# Patient Record
Sex: Male | Born: 1970 | Hispanic: Yes | Marital: Married | Smoking: Never smoker
Health system: Southern US, Community
[De-identification: ages and names within clinical notes are randomized; demographics above are authoritative.]

## PROBLEM LIST (undated history)

## (undated) DIAGNOSIS — Z789 Other specified health status: Secondary | ICD-10-CM

## (undated) DIAGNOSIS — F1090 Alcohol use, unspecified, uncomplicated: Secondary | ICD-10-CM

---

## 2020-10-12 ENCOUNTER — Emergency Department: Payer: Medicaid Other

## 2020-10-12 ENCOUNTER — Other Ambulatory Visit: Payer: Self-pay

## 2020-10-12 ENCOUNTER — Encounter: Payer: Self-pay | Admitting: Internal Medicine

## 2020-10-12 ENCOUNTER — Inpatient Hospital Stay
Admission: EM | Admit: 2020-10-12 | Discharge: 2020-10-16 | DRG: 282 | Disposition: A | Discharge status: Other Acute Inpatient Hospital | Payer: Medicaid Other | Source: Ambulatory Visit | Attending: Student | Admitting: Student

## 2020-10-12 DIAGNOSIS — Z8249 Family history of ischemic heart disease and other diseases of the circulatory system: Secondary | ICD-10-CM

## 2020-10-12 DIAGNOSIS — I25110 Atherosclerotic heart disease of native coronary artery with unstable angina pectoris: Secondary | ICD-10-CM | POA: Diagnosis present

## 2020-10-12 DIAGNOSIS — Z833 Family history of diabetes mellitus: Secondary | ICD-10-CM | POA: Diagnosis not present

## 2020-10-12 DIAGNOSIS — Z20822 Contact with and (suspected) exposure to covid-19: Secondary | ICD-10-CM | POA: Diagnosis present

## 2020-10-12 DIAGNOSIS — F17200 Nicotine dependence, unspecified, uncomplicated: Secondary | ICD-10-CM | POA: Diagnosis present

## 2020-10-12 DIAGNOSIS — I214 Non-ST elevation (NSTEMI) myocardial infarction: Principal | ICD-10-CM | POA: Diagnosis present

## 2020-10-12 DIAGNOSIS — Z789 Other specified health status: Secondary | ICD-10-CM | POA: Insufficient documentation

## 2020-10-12 DIAGNOSIS — I2584 Coronary atherosclerosis due to calcified coronary lesion: Secondary | ICD-10-CM | POA: Diagnosis present

## 2020-10-12 DIAGNOSIS — I10 Essential (primary) hypertension: Secondary | ICD-10-CM | POA: Diagnosis present

## 2020-10-12 DIAGNOSIS — E785 Hyperlipidemia, unspecified: Secondary | ICD-10-CM | POA: Diagnosis present

## 2020-10-12 DIAGNOSIS — I2541 Coronary artery aneurysm: Secondary | ICD-10-CM | POA: Diagnosis present

## 2020-10-12 DIAGNOSIS — F1090 Alcohol use, unspecified, uncomplicated: Secondary | ICD-10-CM | POA: Diagnosis present

## 2020-10-12 DIAGNOSIS — Z597 Insufficient social insurance and welfare support: Secondary | ICD-10-CM

## 2020-10-12 DIAGNOSIS — R079 Chest pain, unspecified: Secondary | ICD-10-CM

## 2020-10-12 HISTORY — DX: Other specified health status: Z78.9

## 2020-10-12 HISTORY — DX: Alcohol use, unspecified, uncomplicated: F10.90

## 2020-10-12 LAB — URINE DRUG SCREEN, QUALITATIVE (ARMC ONLY)
Amphetamines, Ur Screen: NOT DETECTED
Barbiturates, Ur Screen: NOT DETECTED
Benzodiazepine, Ur Scrn: NOT DETECTED
Cannabinoid 50 Ng, Ur ~~LOC~~: NOT DETECTED
Cocaine Metabolite,Ur ~~LOC~~: NOT DETECTED
MDMA (Ecstasy)Ur Screen: NOT DETECTED
Methadone Scn, Ur: NOT DETECTED
Opiate, Ur Screen: NOT DETECTED
Phencyclidine (PCP) Ur S: NOT DETECTED
Tricyclic, Ur Screen: NOT DETECTED

## 2020-10-12 LAB — CBC
HCT: 45.5 % (ref 39.0–52.0)
Hemoglobin: 16.1 g/dL (ref 13.0–17.0)
MCH: 34.5 pg — ABNORMAL HIGH (ref 26.0–34.0)
MCHC: 35.4 g/dL (ref 30.0–36.0)
MCV: 97.4 fL (ref 80.0–100.0)
Platelets: 144 10*3/uL — ABNORMAL LOW (ref 150–400)
RBC: 4.67 MIL/uL (ref 4.22–5.81)
RDW: 12.6 % (ref 11.5–15.5)
WBC: 7.9 10*3/uL (ref 4.0–10.5)
nRBC: 0 % (ref 0.0–0.2)

## 2020-10-12 LAB — BASIC METABOLIC PANEL
Anion gap: 9 (ref 5–15)
BUN: 11 mg/dL (ref 6–20)
CO2: 26 mmol/L (ref 22–32)
Calcium: 9.6 mg/dL (ref 8.9–10.3)
Chloride: 100 mmol/L (ref 98–111)
Creatinine, Ser: 0.7 mg/dL (ref 0.61–1.24)
GFR, Estimated: >60 mL/min (ref >60)
Glucose, Bld: 86 mg/dL (ref 70–99)
Potassium: 3.6 mmol/L (ref 3.5–5.1)
Sodium: 135 mmol/L (ref 135–145)

## 2020-10-12 LAB — TROPONIN I (HIGH SENSITIVITY)
Troponin I (High Sensitivity): 143 ng/L (ref <18)
Troponin I (High Sensitivity): 164 ng/L (ref <18)

## 2020-10-12 LAB — PROTIME-INR
INR: 1 (ref 0.8–1.2)
Prothrombin Time: 13.5 seconds (ref 11.4–15.2)

## 2020-10-12 LAB — RESP PANEL BY RT-PCR (FLU A&B, COVID) ARPGX2
Influenza A by PCR: NEGATIVE
Influenza B by PCR: NEGATIVE
SARS Coronavirus 2 by RT PCR: NEGATIVE

## 2020-10-12 LAB — HEPARIN LEVEL (UNFRACTIONATED): Heparin Unfractionated: 0.18 IU/mL — ABNORMAL LOW (ref 0.30–0.70)

## 2020-10-12 LAB — APTT: aPTT: 30 seconds (ref 24–36)

## 2020-10-12 MED ORDER — LORAZEPAM 2 MG/ML IJ SOLN: 0.0000 mg | Freq: Two times a day (BID) | INTRAMUSCULAR | Status: DC

## 2020-10-12 MED ORDER — ADULT MULTIVITAMIN W/MINERALS CH
1.0000 | ORAL_TABLET | Freq: Every day | ORAL | Status: DC
  Administered 2020-10-12 – 2020-10-16 (×4): 1 via ORAL
  Filled 2020-10-12 (×5): qty 1

## 2020-10-12 MED ORDER — ATORVASTATIN CALCIUM 20 MG PO TABS
40.0000 mg | ORAL_TABLET | Freq: Every day | ORAL | Status: DC
  Administered 2020-10-12 – 2020-10-13 (×2): 40 mg via ORAL
  Filled 2020-10-12 (×2): qty 2

## 2020-10-12 MED ORDER — THIAMINE HCL 100 MG/ML IJ SOLN
100.0000 mg | Freq: Every day | INTRAMUSCULAR | Status: DC
  Filled 2020-10-12 (×3): qty 2

## 2020-10-12 MED ORDER — FOLIC ACID 1 MG PO TABS
1.0000 mg | ORAL_TABLET | Freq: Every day | ORAL | Status: DC
  Administered 2020-10-12 – 2020-10-16 (×4): 1 mg via ORAL
  Filled 2020-10-12 (×5): qty 1

## 2020-10-12 MED ORDER — ASPIRIN 81 MG PO CHEW
81.0000 mg | CHEWABLE_TABLET | Freq: Every day | ORAL | Status: DC
  Administered 2020-10-13 – 2020-10-16 (×3): 81 mg via ORAL
  Filled 2020-10-12 (×4): qty 1

## 2020-10-12 MED ORDER — THIAMINE HCL 100 MG PO TABS
100.0000 mg | ORAL_TABLET | Freq: Every day | ORAL | Status: DC
  Administered 2020-10-12 – 2020-10-16 (×3): 100 mg via ORAL
  Filled 2020-10-12 (×4): qty 1

## 2020-10-12 MED ORDER — ONDANSETRON HCL 4 MG/2ML IJ SOLN: 4.0000 mg | Freq: Three times a day (TID) | INTRAMUSCULAR | Status: DC | PRN

## 2020-10-12 MED ORDER — HEPARIN BOLUS VIA INFUSION
4000.0000 [IU] | Freq: Once | INTRAVENOUS | Status: AC
  Administered 2020-10-12: 4000 [IU] via INTRAVENOUS
  Filled 2020-10-12: qty 4000

## 2020-10-12 MED ORDER — ACETAMINOPHEN 325 MG PO TABS: 650.0000 mg | ORAL_TABLET | Freq: Four times a day (QID) | ORAL | Status: DC | PRN

## 2020-10-12 MED ORDER — HYDRALAZINE HCL 20 MG/ML IJ SOLN: 5.0000 mg | INTRAMUSCULAR | Status: DC | PRN

## 2020-10-12 MED ORDER — NITROGLYCERIN 0.4 MG SL SUBL
0.4000 mg | SUBLINGUAL_TABLET | SUBLINGUAL | Status: DC | PRN
Start: 2020-10-12 — End: 2020-10-17

## 2020-10-12 MED ORDER — SODIUM CHLORIDE 0.9 % IV SOLN: INTRAVENOUS | Status: DC

## 2020-10-12 MED ORDER — LORAZEPAM 2 MG/ML IJ SOLN: 0.0000 mg | Freq: Four times a day (QID) | INTRAMUSCULAR | Status: DC

## 2020-10-12 MED ORDER — MORPHINE SULFATE (PF) 2 MG/ML IV SOLN: 2.0000 mg | INTRAVENOUS | Status: DC | PRN

## 2020-10-12 MED ORDER — LORAZEPAM 1 MG PO TABS: 1.0000 mg | ORAL_TABLET | ORAL | Status: DC | PRN

## 2020-10-12 MED ORDER — ASPIRIN 81 MG PO CHEW
324.0000 mg | CHEWABLE_TABLET | Freq: Once | ORAL | Status: AC
  Administered 2020-10-12: 324 mg via ORAL
  Filled 2020-10-12: qty 4

## 2020-10-12 MED ORDER — HEPARIN (PORCINE) 25000 UT/250ML-% IV SOLN
1100.0000 [IU]/h | INTRAVENOUS | Status: DC
  Administered 2020-10-12: 850 [IU]/h via INTRAVENOUS
  Administered 2020-10-13: 1100 [IU]/h via INTRAVENOUS
  Filled 2020-10-12 (×2): qty 250

## 2020-10-12 MED ORDER — SODIUM CHLORIDE 0.9% FLUSH
3.0000 mL | Freq: Two times a day (BID) | INTRAVENOUS | Status: DC
  Administered 2020-10-12 – 2020-10-16 (×5): 3 mL via INTRAVENOUS

## 2020-10-12 MED ORDER — LORAZEPAM 2 MG/ML IJ SOLN: 1.0000 mg | INTRAMUSCULAR | Status: DC | PRN

## 2020-10-12 NOTE — Consult Note (Signed)
[facility] Cardiology Consultation Note  Patient ID: Nathan Nielsen, MRN: [id number], DOB/AGE: [id number] y.o. Admit date: 10/12/2020   Date of Consult: 10/12/2020 Primary Physician: Pcp, No Primary Cardiologist: None  Chief Complaint:  Chief Complaint  Patient presents with  . Chest Pain   Reason for Consult:  Chest pain with elevated troponin  HPI: 50 y.o. male with no significant past medical history who presents to the ED with complaints of intermittent chest pain radiating to the neck and down both of his arms that is associated with exercise and relieved by rest.  Patient has had chest pain for approximately 1 week typically located in the substernal area described as a pressure which radiates to the neck and down both arms.  Patient also endorses worsening chest pain and shortness of breath with exercise that is relieved by rest.  Patient has no history of cardiovascular disease and denies a history of hypertension, hyperlipidemia, type 2 diabetes.  Patient does have a significant family history of cardiovascular disease.  Patient was found to have an elevated troponin of 143 in the ED with negative chest x-ray.  Past Medical History:  Diagnosis Date  . Alcohol use    4-5 times per week, 4-5 beers each time      Surgical History: History reviewed. No pertinent surgical history.   Home Meds: Prior to Admission medications   Medication Sig Start Date End Date Taking? Authorizing Provider  acetaminophen (TYLENOL) 325 MG tablet Take 650 mg by mouth every 6 (six) hours as needed for mild pain.   Yes [provider]    Inpatient Medications:  . [START ON 10/13/2020] aspirin  81 mg Oral Daily  . atorvastatin  40 mg Oral Daily  . folic acid  1 mg Oral Daily  . LORazepam  0-4 mg Intravenous Q6H   Followed by  . [START ON 10/14/2020] LORazepam  0-4 mg Intravenous Q12H  . multivitamin with minerals  1 tablet Oral Daily  . thiamine  100 mg Oral Daily   Or  .  thiamine  100 mg Intravenous Daily   . heparin 850 Units/hr (10/12/20 1552)    Allergies: No Known Allergies  Social History   Socioeconomic History  . Marital status: Married    Spouse name: Not on file  . Number of children: Not on file  . Years of education: Not on file  . Highest education level: Not on file  Occupational History  . Not on file  Tobacco Use  . Smoking status: Never  . Smokeless tobacco: Never  Substance and Sexual Activity  . Alcohol use: Yes  . Drug use: Never  . Sexual activity: Not on file  Other Topics Concern  . Not on file  Social History Narrative  . Not on file   Social Determinants of Health   Financial Resource Strain: Not on file  Food Insecurity: Not on file  Transportation Needs: Not on file  Physical Activity: Not on file  Stress: Not on file  Social Connections: Not on file  Intimate Partner Violence: Not on file     Family History  Problem Relation Age of Onset  . Diabetes Mellitus II Mother   . Hypertension Mother   . Hypertension Brother      Review of Systems Positive for fatigue, chest pain, shortness of breath Negative for: General:  chills, fever, night sweats or weight changes.  Cardiovascular: PND orthopnea syncope dizziness  Dermatological skin lesions rashes Respiratory: Cough congestion Urologic:  Frequent urination urination at night and hematuria Abdominal: negative for nausea, vomiting, diarrhea, bright red blood per rectum, melena, or hematemesis Neurologic: negative for visual changes, and/or hearing changes  All other systems reviewed and are otherwise negative except as noted above.  Labs: No results for input(s): CKTOTAL, CKMB, TROPONINI in the last 72 hours. Lab Results  Component Value Date   WBC 7.9 10/12/2020   HGB 16.1 10/12/2020   HCT 45.5 10/12/2020   MCV 97.4 10/12/2020   PLT 144 (L) 10/12/2020    Recent Labs  Lab 10/12/20 1205  NA 135  K 3.6  CL 100  CO2 26  BUN 11  CREATININE  0.70  CALCIUM 9.6  GLUCOSE 86   No results found for: CHOL, HDL, LDLCALC, TRIG No results found for: DDIMER  Radiology/Studies:  DG Chest 2 View  Result Date: 10/12/2020 CLINICAL DATA:  Chest pain EXAM: CHEST - 2 VIEW COMPARISON:  None. FINDINGS: The heart size and mediastinal contours are within normal limits. Both lungs are clear. The visualized skeletal structures are unremarkable. IMPRESSION: No active cardiopulmonary disease. Electronically Signed   By: Wiliam Ke M.D.   On: 10/12/2020 12:46    EKG: Normal sinus rhythm  Weights: Filed Weights   10/12/20 1201  Weight: 81.6 kg     Physical Exam: Blood pressure (!) 144/78, pulse 71, temperature 98.1 F (36.7 C), temperature source Oral, resp. rate 18, height 5\' 3"  (1.6 m), weight 81.6 kg, SpO2 99 %. Body mass index is 31.89 kg/m. General: Well developed, well nourished, in no acute distress. Head eyes ears nose throat: Normocephalic, atraumatic, sclera non-icteric, no xanthomas, nares are without discharge. No apparent thyromegaly and/or mass  Lungs: Normal respiratory effort.  no wheezes, no rales, no rhonchi.  Heart: RRR with normal S1 S2. no murmur gallop, no rub, PMI is normal size and placement, carotid upstroke normal without bruit, jugular venous pressure is normal Abdomen: Soft, non-tender, non-distended with normoactive bowel sounds. No hepatomegaly. No rebound/guarding. No obvious abdominal masses. Abdominal aorta is normal size without bruit Extremities: No edema. no cyanosis, no clubbing, no ulcers  Peripheral : 2+ bilateral upper extremity pulses, 2+ bilateral femoral pulses, 2+ bilateral dorsal pedal pulse Neuro: Alert and oriented. No facial asymmetry. No focal deficit. Moves all extremities spontaneously. Musculoskeletal: Normal muscle tone without kyphosis Psych:  Responds to questions appropriately with a normal affect.    Assessment: 50 yo male with no significant past medical history presenting to the  ED with a 1 week history of chest pain and shortness of breath exacerbated by exercise and relieved by rest with radiation to the neck and arms, with mildly elevated troponin concerning for unstable angina  Plan: -Continue IV heparin, lipitor, aspirin -Continue to trend troponin -PRN nitroglycerin and morphine for chest pain management -Schedule cardiac catheterization for evaluation of coronary artery disease and unstable angina  Signed, [address] [facility] Cardiology 10/12/2020, 5:14 PM

## 2020-10-12 NOTE — ED Triage Notes (Signed)
Pt here with CP that started a week ago. Pt was sent here from his primary's office. Pt states pain is generalized and radiates to his right arm. Pt states that pain is intermittent and only occurs when he is doing activity. Pt also radiates to his neck. Pt in NAD in triage.

## 2020-10-12 NOTE — H&P (Signed)
History and Physical    Nathan Nielsen HQI:[id number] DOB: [birth year] DOA: 10/12/2020  Referring MD/NP/PA:   PCP: Pcp, No   Patient coming from:  The patient is coming from home.  At baseline, pt is independent for most of ADL.        Chief Complaint: chest pain   HPI: Nathan Nielsen is a 50 y.o. male with medical history significant of alcohol use, who presents with chest pain.  Patient has chest pain for almost a week, which is located in substernal area, intermittent, mild to moderate, pressure-like, exertional, radiating to the jaw, right arm and neck.  Patient also has exertional shortness of breath.  No cough, fever or chills.  Denies nausea, vomiting, diarrhea or abdominal pain.  No symptoms of UTI.  No recent head injury or rectal bleeding or dark stool. Patient states that she drinks beer 4-5 times per week and 4-5 beers each time.  ED Course: pt was found to have troponin level 143, pending COVID-19 PCR, electrolytes renal function okay, temperature normal, blood pressure 144/78, heart rate 71, RR 18, oxygen saturation 99% on room air.  Chest x-ray negative.  Patient is admitted to progressive bed as inpatient.  Dr. Gwen Pounds of cardiology is consulted  Review of Systems:   General: no fevers, chills, no body weight gain, has fatigue HEENT: no blurry vision, hearing changes or sore throat Respiratory: has dyspnea, no coughing, wheezing CV: has chest pain, no palpitations GI: no nausea, vomiting, abdominal pain, diarrhea, constipation GU: no dysuria, burning on urination, increased urinary frequency, hematuria  Ext: no leg edema Neuro: no unilateral weakness, numbness, or tingling, no vision change or hearing loss Skin: no rash, no skin tear. MSK: No muscle spasm, no deformity, no limitation of range of movement in spin Heme: No easy bruising.  Travel history: No recent long distant travel.  Allergy: No Known Allergies  Past Medical History:  Diagnosis Date    Alcohol use    4-5 times per week, 4-5 beers each time    History reviewed. No pertinent surgical history.  Social History:  reports that he has never smoked. He has never used smokeless tobacco. He reports current alcohol use. He reports that he does not use drugs.  Family History:  Family History  Problem Relation Age of Onset   Diabetes Mellitus II Mother    Hypertension Mother    Hypertension Brother      Prior to Admission medications   Not on File    Physical Exam: Vitals:   10/12/20 1201  BP: (!) 144/78  Pulse: 71  Resp: 18  Temp: 98.1 F (36.7 C)  TempSrc: Oral  SpO2: 99%  Weight: 81.6 kg  Height: 5\' 3"  (1.6 m)   General: Not in acute distress HEENT:       Eyes: PERRL, EOMI, no scleral icterus.       ENT: No discharge from the ears and nose, no pharynx injection, no tonsillar enlargement.        Neck: No JVD, no bruit, no mass felt. Heme: No neck lymph node enlargement. Cardiac: S1/S2, RRR, No murmurs, No gallops or rubs. Respiratory: No rales, wheezing, rhonchi or rubs. GI: Soft, nondistended, nontender, no rebound pain, no organomegaly, BS present. GU: No hematuria Ext: No pitting leg edema bilaterally. 1+DP/PT pulse bilaterally. Musculoskeletal: No joint deformities, No joint redness or warmth, no limitation of ROM in spin. Skin: No rashes.  Neuro: Alert, oriented X3, cranial nerves II-XII grossly intact, moves all  extremities normally. Psych: Patient is not psychotic, no suicidal or hemocidal ideation.  Labs on Admission: I have personally reviewed following labs and imaging studies  CBC: Recent Labs  Lab 10/12/20 1205  WBC 7.9  HGB 16.1  HCT 45.5  MCV 97.4  PLT 144*   Basic Metabolic Panel: Recent Labs  Lab 10/12/20 1205  NA 135  K 3.6  CL 100  CO2 26  GLUCOSE 86  BUN 11  CREATININE 0.70  CALCIUM 9.6   GFR: Estimated Creatinine Clearance: 106.7 mL/min (by C-G formula based on SCr of 0.7 mg/dL). Liver Function Tests: No results  for input(s): AST, ALT, ALKPHOS, BILITOT, PROT, ALBUMIN in the last 168 hours. No results for input(s): LIPASE, AMYLASE in the last 168 hours. No results for input(s): AMMONIA in the last 168 hours. Coagulation Profile: No results for input(s): INR, PROTIME in the last 168 hours. Cardiac Enzymes: No results for input(s): CKTOTAL, CKMB, CKMBINDEX, TROPONINI in the last 168 hours. BNP (last 3 results) No results for input(s): PROBNP in the last 8760 hours. HbA1C: No results for input(s): HGBA1C in the last 72 hours. CBG: No results for input(s): GLUCAP in the last 168 hours. Lipid Profile: No results for input(s): CHOL, HDL, LDLCALC, TRIG, CHOLHDL, LDLDIRECT in the last 72 hours. Thyroid Function Tests: No results for input(s): TSH, T4TOTAL, FREET4, T3FREE, THYROIDAB in the last 72 hours. Anemia Panel: No results for input(s): VITAMINB12, FOLATE, FERRITIN, TIBC, IRON, RETICCTPCT in the last 72 hours. Urine analysis: No results found for: COLORURINE, APPEARANCEUR, LABSPEC, PHURINE, GLUCOSEU, HGBUR, BILIRUBINUR, KETONESUR, PROTEINUR, UROBILINOGEN, NITRITE, LEUKOCYTESUR Sepsis Labs: @LABRCNTIP (procalcitonin:4,lacticidven:4) )No results found for this or any previous visit (from the past 240 hour(s)).   Radiological Exams on Admission: DG Chest 2 View  Result Date: 10/12/2020 CLINICAL DATA:  Chest pain EXAM: CHEST - 2 VIEW COMPARISON:  None. FINDINGS: The heart size and mediastinal contours are within normal limits. Both lungs are clear. The visualized skeletal structures are unremarkable. IMPRESSION: No active cardiopulmonary disease. Electronically Signed   By: 12/12/2020 M.D.   On: 10/12/2020 12:46     EKG: I have personally reviewed.  Sinus rhythm, T wave flattening  Assessment/Plan Principal Problem:   NSTEMI (non-ST elevated myocardial infarction) [facility]) Active Problems:   Alcohol use   NSTEMI (non-ST elevated myocardial infarction) [facility]): Troponin 143.  Symptoms are  typical for non-STEMI.  Dr. [facility] of cardiology is consulted.  -Admit to progressive unit as inpatient -IV heparin - Trend Trop - Repeat EKG in the am  - prn Nitroglycerin, Morphine - start aspirin, lipitor  - Risk factor stratification: will check FLP and A1C   Alcohol use: -Did counseling about the importance of quitting drinking -CIWA protocol     DVT ppx: on IV Heparin     Code Status: Full code Family Communication:     Yes, patient's  wife at bed side Disposition Plan:  Anticipate discharge back to previous environment Consults called: Dr. Gwen Pounds of cardiology Admission status and  Level of care: Progressive Cardiac:     as inpt        Status is: Inpatient  Remains inpatient appropriate because:Inpatient level of care appropriate due to severity of illness  Dispo: The patient is from: Home              Anticipated d/c is to: Home              Patient currently is not medically stable to d/c.   Difficult to place  patient No          Date of Service 10/12/2020    Lorretta Harp [facility]   If 7PM-7AM, please contact night-coverage [email or web address] 10/12/2020, 1:57 PM

## 2020-10-12 NOTE — ED Provider Notes (Signed)
[facility] Emergency Department Provider Note    Event Date/Time   First MD Initiated Contact with Patient 10/12/20 1300     (approximate)  I have reviewed the triage vital signs and the nursing notes.   HISTORY  Chief Complaint Chest Pain    HPI Donevin Jereme Loren is a 50 y.o. male presents the ER for several days of progressively worsening exertional chest pain and pressure radiating pain to his jaw associated with diaphoresis.  Is having some mild pressure and discomfort this morning and today.  No known cardiac history does have family history of cardiac disease.  No nausea or vomiting.  Does smoke occasionally.  Does not typically go to doctors does not know whether he has history of high blood pressure high cholesterol or diabetes.  No past medical history on file. No family history on file.  Patient Active Problem List   Diagnosis Date Noted   NSTEMI (non-ST elevated myocardial infarction) (HCC) 10/12/2020       Prior to Admission medications   Not on File    Allergies Patient has no known allergies.    Social History    Review of Systems Patient denies headaches, rhinorrhea, blurry vision, numbness, shortness of breath, chest pain, edema, cough, abdominal pain, nausea, vomiting, diarrhea, dysuria, fevers, rashes or hallucinations unless otherwise stated above in HPI. ____________________________________________   PHYSICAL EXAM:  VITAL SIGNS: Vitals:   10/12/20 1201  BP: (!) 144/78  Pulse: 71  Resp: 18  Temp: 98.1 F (36.7 C)  SpO2: 99%    Constitutional: Alert and oriented.  Eyes: Conjunctivae are normal.  Head: Atraumatic. Nose: No congestion/rhinnorhea. Mouth/Throat: Mucous membranes are moist.   Neck: No stridor. Painless ROM.  Cardiovascular: Normal rate, regular rhythm. Grossly normal heart sounds.  Good peripheral circulation. Respiratory: Normal respiratory effort.  No retractions. Lungs  CTAB. Gastrointestinal: Soft and nontender. No distention. No abdominal bruits. No CVA tenderness. Genitourinary:  Musculoskeletal: No lower extremity tenderness nor edema.  No joint effusions. Neurologic:  Normal speech and language. No gross focal neurologic deficits are appreciated. No facial droop Skin:  Skin is warm, dry and intact. No rash noted. Psychiatric: Mood and affect are normal. Speech and behavior are normal.  ____________________________________________   LABS (all labs ordered are listed, but only abnormal results are displayed)  Results for orders placed or performed during the hospital encounter of 10/12/20 (from the past 24 hour(s))  Basic metabolic panel     Status: None   Collection Time: 10/12/20 12:05 PM  Result Value Ref Range   Sodium 135 135 - 145 mmol/L   Potassium 3.6 3.5 - 5.1 mmol/L   Chloride 100 98 - 111 mmol/L   CO2 26 22 - 32 mmol/L   Glucose, Bld 86 70 - 99 mg/dL   BUN 11 6 - 20 mg/dL   Creatinine, Ser 5.17 0.61 - 1.24 mg/dL   Calcium 9.6 8.9 - 00.1 mg/dL   GFR, Estimated >74 >94 mL/min   Anion gap 9 5 - 15  CBC     Status: Abnormal   Collection Time: 10/12/20 12:05 PM  Result Value Ref Range   WBC 7.9 4.0 - 10.5 K/uL   RBC 4.67 4.22 - 5.81 MIL/uL   Hemoglobin 16.1 13.0 - 17.0 g/dL   HCT 49.6 75.9 - 16.3 %   MCV 97.4 80.0 - 100.0 fL   MCH 34.5 (H) 26.0 - 34.0 pg   MCHC 35.4 30.0 - 36.0 g/dL   RDW  12.6 11.5 - 15.5 %   Platelets 144 (L) 150 - 400 K/uL   nRBC 0.0 0.0 - 0.2 %  Troponin I (High Sensitivity)     Status: Abnormal   Collection Time: 10/12/20 12:05 PM  Result Value Ref Range   Troponin I (High Sensitivity) 143 (HH) <18 ng/L   ____________________________________________  EKG My review and personal interpretation at Time: 11:57   Indication: chest pain  Rate: 70  Rhythm: sinus Axis: normal Other: normal intervals, inferolateral st depression, no stemi ____________________________________________  RADIOLOGY  I personally  reviewed all radiographic images ordered to evaluate for the above acute complaints and reviewed radiology reports and findings.  These findings were personally discussed with the patient.  Please see medical record for radiology report.  ____________________________________________   PROCEDURES  Procedure(s) performed:  .Critical Care Performed by: Willy Eddy, MD Authorized by: Willy Eddy, MD   Critical care provider statement:    Critical care time (minutes):  15   Critical care time was exclusive of:  Separately billable procedures and treating other patients   Critical care was necessary to treat or prevent imminent or life-threatening deterioration of the following conditions:  Cardiac failure   Critical care was time spent personally by me on the following activities:  Development of treatment plan with patient or surrogate, discussions with consultants, evaluation of patient's response to treatment, examination of patient, obtaining history from patient or surrogate, ordering and performing treatments and interventions, ordering and review of laboratory studies, ordering and review of radiographic studies, pulse oximetry, re-evaluation of patient's condition and review of old charts    Critical Care performed: yes ____________________________________________   INITIAL IMPRESSION / ASSESSMENT AND PLAN / ED COURSE  Pertinent labs & imaging results that were available during my care of the patient were reviewed by me and considered in my medical decision making (see chart for details).   DDX: ACS, pericarditis, esophagitis, boerhaaves, pe, dissection, pna, bronchitis, costochondritis  Ozro Eydan Chianese is a 50 y.o. who presents to the ED with symptoms as described above.  Symptoms are concerning for unstable angina he still having some discomfort and pressure with findings concerning for NSTEMI as troponin is elevated to 140s does have some nonspecific ST T wave  changes with inferolateral ST depression.  No STEMI criteria.  Will give aspirin and heparinize.  I discussed with hospitalist for admission.     The patient was evaluated in Emergency Department today for the symptoms described in the history of present illness. He/she was evaluated in the context of the global COVID-19 pandemic, which necessitated consideration that the patient might be at risk for infection with the SARS-CoV-2 virus that causes COVID-19. Institutional protocols and algorithms that pertain to the evaluation of patients at risk for COVID-19 are in a state of rapid change based on information released by regulatory bodies including the CDC and federal and state organizations. These policies and algorithms were followed during the patient's care in the ED.  As part of my medical decision making, I reviewed the following data within the electronic MEDICAL RECORD NUMBER Nursing notes reviewed and incorporated, Labs reviewed, notes from prior ED visits and [place] Controlled Substance Database   ____________________________________________   FINAL CLINICAL IMPRESSION(S) / ED DIAGNOSES  Final diagnoses:  Chest pain, unspecified type      NEW MEDICATIONS STARTED DURING THIS VISIT:  New Prescriptions   No medications on file     Note:  This document was prepared using Dragon voice recognition software  and may include unintentional dictation errors.    Willy Eddy, MD 10/12/20 [phone]

## 2020-10-12 NOTE — ED Notes (Signed)
Blue top sent to lab at this time 

## 2020-10-12 NOTE — ED Notes (Signed)
Pt presents to the ED for CP that radiates up his neck with exertion for approx a week. Pt states the pain gets worse when he gets up to move or exert himself. Pt endorses mild SOB, states that he does get clammy and diaphoretic when his CP starts. Denies cough, fever, or NVD. Denies significant cardiac hx. Pt is A&Ox4 and NAD.

## 2020-10-12 NOTE — Consult Note (Signed)
ANTICOAGULATION CONSULT NOTE - Initial Consult  Pharmacy Consult for heparin  Indication: chest pain/ACS  No Known Allergies  Patient Measurements: Height: 5\' 3"  (160 cm) Weight: 81.6 kg (180 lb) IBW/kg (Calculated) : 56.9 Heparin Dosing Weight: 74.3kg  Vital Signs: Temp: 98.1 F (36.7 C) (10/10 1201) Temp Source: Oral (10/10 1201) BP: 144/78 (10/10 1201) Pulse Rate: 71 (10/10 1201)  Labs: Recent Labs    10/12/20 1205  HGB 16.1  HCT 45.5  PLT 144*  CREATININE 0.70  TROPONINIHS 143*    Estimated Creatinine Clearance: 106.7 mL/min (by C-G formula based on SCr of 0.7 mg/dL).   Medical History: No past medical history on file.   Assessment: 50yo M with no PMH on file was admitted to the hospital for chest pain. The pharmacy was consulted for heparin dosing.   Baseline PT/INR and aPTT ordered  PLT noted to be 144, otherwise CBC within normal limits   No PTA anticoagulants noted on chart review    Goal of Therapy:  Heparin level 0.3-0.7 units/ml Monitor platelets by anticoagulation protocol: Yes   Plan:  Give 4000 units bolus x 1 Start heparin infusion at 850 units/hr Check anti-Xa level in 6 hours and daily while on heparin Continue to monitor H&H and platelets  50yo, PharmD Pharmacy Resident  10/12/2020 1:22 PM

## 2020-10-13 ENCOUNTER — Encounter
Admission: EM | Disposition: A | Discharge status: Other Acute Inpatient Hospital | Payer: Self-pay | Source: Ambulatory Visit | Attending: Student

## 2020-10-13 ENCOUNTER — Other Ambulatory Visit: Payer: Self-pay

## 2020-10-13 HISTORY — PX: LEFT HEART CATH AND CORONARY ANGIOGRAPHY: CATH118249

## 2020-10-13 LAB — LIPID PANEL
Cholesterol: 286 mg/dL — ABNORMAL HIGH (ref 0–200)
HDL: 50 mg/dL (ref >40)
LDL Cholesterol: 209 mg/dL — ABNORMAL HIGH (ref 0–99)
Total CHOL/HDL Ratio: 5.7 RATIO
Triglycerides: 133 mg/dL (ref <150)
VLDL: 27 mg/dL (ref 0–40)

## 2020-10-13 LAB — CARDIAC CATHETERIZATION: Cath EF Quantitative: 65 %

## 2020-10-13 LAB — CBC
HCT: 44 % (ref 39.0–52.0)
Hemoglobin: 15.8 g/dL (ref 13.0–17.0)
MCH: 35 pg — ABNORMAL HIGH (ref 26.0–34.0)
MCHC: 35.9 g/dL (ref 30.0–36.0)
MCV: 97.3 fL (ref 80.0–100.0)
Platelets: 126 10*3/uL — ABNORMAL LOW (ref 150–400)
RBC: 4.52 MIL/uL (ref 4.22–5.81)
RDW: 12.8 % (ref 11.5–15.5)
WBC: 6 10*3/uL (ref 4.0–10.5)
nRBC: 0 % (ref 0.0–0.2)

## 2020-10-13 LAB — HEMOGLOBIN A1C
Hgb A1c MFr Bld: 6.1 % — ABNORMAL HIGH (ref 4.8–5.6)
Mean Plasma Glucose: 128 mg/dL

## 2020-10-13 LAB — HIV ANTIBODY (ROUTINE TESTING W REFLEX): HIV Screen 4th Generation wRfx: NONREACTIVE

## 2020-10-13 LAB — TROPONIN I (HIGH SENSITIVITY): Troponin I (High Sensitivity): 147 ng/L (ref <18)

## 2020-10-13 LAB — HEPARIN LEVEL (UNFRACTIONATED): Heparin Unfractionated: 0.56 IU/mL (ref 0.30–0.70)

## 2020-10-13 SURGERY — LEFT HEART CATH AND CORONARY ANGIOGRAPHY
Anesthesia: Moderate Sedation

## 2020-10-13 MED ORDER — HEPARIN SODIUM (PORCINE) 1000 UNIT/ML IJ SOLN
INTRAMUSCULAR | Status: DC | PRN
  Administered 2020-10-13: 4000 [IU] via INTRAVENOUS

## 2020-10-13 MED ORDER — SODIUM CHLORIDE 0.9% FLUSH: 3.0000 mL | INTRAVENOUS | Status: DC | PRN

## 2020-10-13 MED ORDER — SODIUM CHLORIDE 0.9 % WEIGHT BASED INFUSION
1.0000 mL/kg/h | INTRAVENOUS | Status: DC
  Administered 2020-10-13: 1 mL/kg/h via INTRAVENOUS

## 2020-10-13 MED ORDER — FENTANYL CITRATE (PF) 100 MCG/2ML IJ SOLN
INTRAMUSCULAR | Status: DC | PRN
  Administered 2020-10-13 (×2): 25 ug via INTRAVENOUS

## 2020-10-13 MED ORDER — HEPARIN (PORCINE) IN NACL 1000-0.9 UT/500ML-% IV SOLN
INTRAVENOUS | Status: AC
  Filled 2020-10-13: qty 1000

## 2020-10-13 MED ORDER — MIDAZOLAM HCL 2 MG/2ML IJ SOLN
INTRAMUSCULAR | Status: DC | PRN
  Administered 2020-10-13 (×2): 1 mg via INTRAVENOUS

## 2020-10-13 MED ORDER — VERAPAMIL HCL 2.5 MG/ML IV SOLN
INTRAVENOUS | Status: AC
  Filled 2020-10-13: qty 2

## 2020-10-13 MED ORDER — LIDOCAINE HCL (PF) 1 % IJ SOLN
INTRAMUSCULAR | Status: DC | PRN
  Administered 2020-10-13: 2 mL

## 2020-10-13 MED ORDER — HEPARIN (PORCINE) IN NACL 2000-0.9 UNIT/L-% IV SOLN
INTRAVENOUS | Status: DC | PRN
  Administered 2020-10-13: 1000 mL

## 2020-10-13 MED ORDER — FENTANYL CITRATE (PF) 100 MCG/2ML IJ SOLN
INTRAMUSCULAR | Status: AC
  Filled 2020-10-13: qty 2

## 2020-10-13 MED ORDER — ONDANSETRON HCL 4 MG/2ML IJ SOLN: 4.0000 mg | Freq: Four times a day (QID) | INTRAMUSCULAR | Status: DC | PRN

## 2020-10-13 MED ORDER — SODIUM CHLORIDE 0.9 % WEIGHT BASED INFUSION
3.0000 mL/kg/h | INTRAVENOUS | Status: DC
  Administered 2020-10-13: 3 mL/kg/h via INTRAVENOUS

## 2020-10-13 MED ORDER — VERAPAMIL HCL 2.5 MG/ML IV SOLN
INTRAVENOUS | Status: DC | PRN
  Administered 2020-10-13: 2.5 mg via INTRA_ARTERIAL

## 2020-10-13 MED ORDER — HEPARIN SODIUM (PORCINE) 1000 UNIT/ML IJ SOLN
INTRAMUSCULAR | Status: AC
  Filled 2020-10-13: qty 1

## 2020-10-13 MED ORDER — LABETALOL HCL 5 MG/ML IV SOLN: 10.0000 mg | INTRAVENOUS | Status: AC | PRN

## 2020-10-13 MED ORDER — ACETAMINOPHEN 325 MG PO TABS: 650.0000 mg | ORAL_TABLET | ORAL | Status: DC | PRN

## 2020-10-13 MED ORDER — IOHEXOL 350 MG/ML SOLN
INTRAVENOUS | Status: DC | PRN
  Administered 2020-10-13: 100 mL

## 2020-10-13 MED ORDER — ASPIRIN 81 MG PO CHEW: 81.0000 mg | CHEWABLE_TABLET | ORAL | Status: DC

## 2020-10-13 MED ORDER — HEPARIN BOLUS VIA INFUSION
2200.0000 [IU] | Freq: Once | INTRAVENOUS | Status: AC
  Administered 2020-10-13: 2200 [IU] via INTRAVENOUS
  Filled 2020-10-13: qty 2200

## 2020-10-13 MED ORDER — HEPARIN (PORCINE) 25000 UT/250ML-% IV SOLN
1000.0000 [IU]/h | INTRAVENOUS | Status: DC
  Administered 2020-10-14: 1250 [IU]/h via INTRAVENOUS
  Administered 2020-10-14: 1100 [IU]/h via INTRAVENOUS
  Administered 2020-10-15: 1250 [IU]/h via INTRAVENOUS
  Administered 2020-10-16: 1150 [IU]/h via INTRAVENOUS
  Filled 2020-10-13 (×4): qty 250

## 2020-10-13 MED ORDER — SODIUM CHLORIDE 0.9 % WEIGHT BASED INFUSION
1.0000 mL/kg/h | INTRAVENOUS | Status: AC
  Administered 2020-10-13: 1 mL/kg/h via INTRAVENOUS

## 2020-10-13 MED ORDER — HYDRALAZINE HCL 20 MG/ML IJ SOLN: 10.0000 mg | INTRAMUSCULAR | Status: AC | PRN

## 2020-10-13 MED ORDER — LIDOCAINE HCL 1 % IJ SOLN
INTRAMUSCULAR | Status: AC
  Filled 2020-10-13: qty 20

## 2020-10-13 MED ORDER — MIDAZOLAM HCL 2 MG/2ML IJ SOLN
INTRAMUSCULAR | Status: AC
  Filled 2020-10-13: qty 2

## 2020-10-13 MED ORDER — SODIUM CHLORIDE 0.9 % IV SOLN: 250.0000 mL | INTRAVENOUS | Status: DC | PRN

## 2020-10-13 SURGICAL SUPPLY — 13 items
CATH 5F 110X4 TIG (CATHETERS) ×2 IMPLANT
CATH INFINITI 5 FR JL3.5 (CATHETERS) ×2 IMPLANT
CATH INFINITI 5FR JK (CATHETERS) ×2 IMPLANT
CATH INFINITI JR4 5F (CATHETERS) ×2 IMPLANT
DEVICE RAD TR BAND REGULAR (VASCULAR PRODUCTS) ×2 IMPLANT
DRAPE BRACHIAL (DRAPES) ×2 IMPLANT
GLIDESHEATH SLEND SS 6F .021 (SHEATH) ×2 IMPLANT
GUIDEWIRE INQWIRE 1.5J.035X260 (WIRE) ×1 IMPLANT
INQWIRE 1.5J .035X260CM (WIRE) ×2
PACK CARDIAC CATH (CUSTOM PROCEDURE TRAY) ×2 IMPLANT
PROTECTION STATION PRESSURIZED (MISCELLANEOUS) ×2
SET ATX SIMPLICITY (MISCELLANEOUS) ×2 IMPLANT
STATION PROTECTION PRESSURIZED (MISCELLANEOUS) ×1 IMPLANT

## 2020-10-13 NOTE — Consult Note (Signed)
ANTICOAGULATION CONSULT NOTE  Pharmacy Consult for heparin  Indication: chest pain/ACS  No Known Allergies  Patient Measurements: Height: 5\' 3"  (160 cm) Weight: 81.6 kg (180 lb) IBW/kg (Calculated) : 56.9 Heparin Dosing Weight: 74.3kg  Vital Signs: Temp: 98.2 F (36.8 C) (10/11 1142) Temp Source: Oral (10/11 1142) BP: 135/67 (10/11 1700) Pulse Rate: 60 (10/11 1700)  Labs: Recent Labs    10/12/20 1205 10/12/20 1312 10/12/20 2208 10/13/20 0620  HGB 16.1  --   --  15.8  HCT 45.5  --   --  44.0  PLT 144*  --   --  126*  APTT  --  30  --   --   LABPROT  --  13.5  --   --   INR  --  1.0  --   --   HEPARINUNFRC  --   --  0.18* 0.56  CREATININE 0.70  --   --   --   TROPONINIHS 143*  --  164* 147*     Estimated Creatinine Clearance: 106.7 mL/min (by C-G formula based on SCr of 0.7 mg/dL).   Medical History: Past Medical History:  Diagnosis Date   Alcohol use    4-5 times per week, 4-5 beers each time     Assessment: 50yo M with no PMH on file was admitted to the hospital for chest pain. The pharmacy was consulted for heparin restart 8h after sheath removal post-cath..   Baseline labs PT: 13.5 INR: 1.0 aPTT: 30 PLT 144 Hgb 16.1  No PTA anticoagulants noted on chart review    Goal of Therapy:  Heparin level 0.3-0.7 units/ml Monitor platelets by anticoagulation protocol: Yes   Plan:  -Will restart heparin at 1100 units/hr 8h after sheath removal (10/12 0030) -Heparin level ordered 6h after heparin restart  -Daily CBC while on heparin ggt  04-25-1973, PharmD Pharmacy Resident  10/13/2020 5:15 PM

## 2020-10-13 NOTE — Progress Notes (Signed)
[facility] Cardiology [facility] Encounter Note  Patient: Nathan Nielsen / Admit Date: 10/12/2020 / Date of Encounter: 10/13/2020, 4:46 PM   Subjective: Patient done well overnight with no evidence of chest discomfort with appropriate [place] medical therapy for non-ST elevation myocardial infarction with peak troponin of 164.  Cardiac catheterization showing normal LV systolic function but significantly complicated two-vessel coronary artery disease with 90% proximal LAD with small aneurysmal segment and 90% mid right coronary artery stenosis.  Review of Systems: Positive for: Normal none Negative for: Vision change, hearing change, syncope, dizziness, nausea, vomiting,diarrhea, bloody stool, stomach pain, cough, congestion, diaphoresis, urinary frequency, urinary pain,skin lesions, skin rashes Others previously listed  Objective: Telemetry: Normal sinus rhythm Physical Exam: Blood pressure 124/85, pulse (!) 50, temperature 98.2 F (36.8 C), temperature source Oral, resp. rate 17, height 5\' 3"  (1.6 m), weight 81.6 kg, SpO2 96 %. Body mass index is 31.89 kg/m. General: Well developed, well nourished, in no acute distress. Head: Normocephalic, atraumatic, sclera non-icteric, no xanthomas, nares are without discharge. Neck: No apparent masses Lungs: Normal respirations with no wheezes, no rhonchi, no rales , no crackles   Heart: Regular rate and rhythm, normal S1 S2, no murmur, no rub, no gallop, PMI is normal size and placement, carotid upstroke normal without bruit, jugular venous pressure normal Abdomen: Soft, non-tender, non-distended with normoactive bowel sounds. No hepatosplenomegaly. Abdominal aorta is normal size without bruit Extremities: No edema, no clubbing, no cyanosis, no ulcers,  Peripheral: 2+ radial, 2+ femoral, 2+ dorsal pedal pulses Neuro: Alert and oriented. Moves all extremities spontaneously. Psych:  Responds to questions appropriately with a normal  affect.  No intake or output data in the 24 hours ending 10/13/20 1646  Inpatient Medications:  . [MAR Hold] aspirin  81 mg Oral Daily  . [START ON 10/14/2020] aspirin  81 mg Oral Pre-Cath  . [MAR Hold] atorvastatin  40 mg Oral Daily  . [MAR Hold] folic acid  1 mg Oral Daily  . [MAR Hold] LORazepam  0-4 mg Intravenous Q6H   Followed by  . [MAR Hold] LORazepam  0-4 mg Intravenous Q12H  . [MAR Hold] multivitamin with minerals  1 tablet Oral Daily  . [MAR Hold] sodium chloride flush  3 mL Intravenous Q12H  . [MAR Hold] thiamine  100 mg Oral Daily   Or  . [MAR Hold] thiamine  100 mg Intravenous Daily   Infusions:  . sodium chloride    . [START ON 10/14/2020] sodium chloride 3 mL/kg/hr (10/13/20 1200)   Followed by  . [START ON 10/14/2020] sodium chloride 1 mL/kg/hr (10/13/20 1200)  . heparin 1,100 Units/hr (10/13/20 1112)    Labs: Recent Labs    10/12/20 1205  NA 135  K 3.6  CL 100  CO2 26  GLUCOSE 86  BUN 11  CREATININE 0.70  CALCIUM 9.6   No results for input(s): AST, ALT, ALKPHOS, BILITOT, PROT, ALBUMIN in the last 72 hours. Recent Labs    10/12/20 1205 10/13/20 0620  WBC 7.9 6.0  HGB 16.1 15.8  HCT 45.5 44.0  MCV 97.4 97.3  PLT 144* 126*   No results for input(s): CKTOTAL, CKMB, TROPONINI in the last 72 hours. Invalid input(s): POCBNP Recent Labs    10/12/20 1205  HGBA1C 6.1*     Weights: Filed Weights   10/12/20 1201 10/13/20 1142  Weight: 81.6 kg 81.6 kg     Radiology/Studies:  DG Chest 2 View  Result Date: 10/12/2020 CLINICAL DATA:  Chest pain EXAM: CHEST -  2 VIEW COMPARISON:  None. FINDINGS: The heart size and mediastinal contours are within normal limits. Both lungs are clear. The visualized skeletal structures are unremarkable. IMPRESSION: No active cardiopulmonary disease. Electronically Signed   By: Wiliam Ke M.D.   On: 10/12/2020 12:46   CARDIAC CATHETERIZATION  Result Date: 10/13/2020 .  Mid RCA lesion is 90% stenosed. Suezanne Jacquet Cx  lesion is 35% stenosed. Suezanne Jacquet LAD to Prox LAD lesion is 65% stenosed. .  Prox LAD lesion is 90% stenosed with 95% stenosed side branch in 1st Sept. .  The left ventricular systolic function is normal. .  LV end diastolic pressure is normal. .  The left ventricular ejection fraction is 55-65% by visual estimate. 50 year old male with no history of previous cardiovascular disease and or risk factor for cardiovascular disease but with a high LDL cholesterol of 209 and borderline hypertension.  Hemoglobin A1c has been 6.2.  He came in with classic angina with a troponin elevation of 164. Left ventricle with normal LV systolic function with ejection fraction of 65% Complicated ostial to proximal LAD calcified with small aneurysmal segment at 60 and 95% Significant 90% proximal to mid right coronary artery atherosclerosis Plan High intensity cholesterol therapy Hypertension control Single antiplatelet therapy until further decisions Discussion with patient and family of treatment options including the possibility of PCI and stent placement of 2 vessels versus two-vessel coronary artery bypass grafting Team discussion with tertiary care cardiovascular surgery and interventional cardiology     Assessment and Recommendation  50 y.o. male with hyperlipidemia borderline hypertension having minimal non-ST elevation microinfarction with preserved ejection fraction and significant two-vessel coronary artery disease with some complicated.  His needing further discussion primary treatment 1.  High intensity cholesterol therapy 2.  Single antiplatelet therapy 3.  Heparin for further evaluation treatment options of minimal myocardial infarction 4.  Further discussion with cardiovascular surgery [facility] interventional cardiology at tertiary center for the possibility of treatment options including PCI and stent placement for coronary bypass graft 15/ 5.  Long discussion with the family about above with decision-making  thereafter  Signed, Arnoldo Hooker M.D. FACC

## 2020-10-13 NOTE — Progress Notes (Signed)
PROGRESS NOTE  Nathan Nielsen URK:[id number] DOB: [birth year] DOA: 10/12/2020 PCP: Pcp, No  Brief History   Nathan Nielsen is a 50 y.o. male with medical history significant of alcohol use, who presents with chest pain.   Patient has chest pain for almost a week, which is located in substernal area, intermittent, mild to moderate, pressure-like, exertional, radiating to the jaw, right arm and neck.  Patient also has exertional shortness of breath.  No cough, fever or chills.  Denies nausea, vomiting, diarrhea or abdominal pain.  No symptoms of UTI.  No recent head injury or rectal bleeding or dark stool. Patient states that she drinks beer 4-5 times per week and 4-5 beers each time.   ED Course: pt was found to have troponin level 143, pending COVID-19 PCR, electrolytes renal function okay, temperature normal, blood pressure 144/78, heart rate 71, RR 18, oxygen saturation 99% on room air.  Chest x-ray negative.  Patient is admitted to progressive bed as inpatient.    The patient was admitted to a PCU bed.   Dr. Gwen Pounds of cardiology was consulted. LHC was obtained on 10/12/2020. It demonstrated: Complicated ostial to proximal LAD calcified with small aneurysmal segment at 60 and 95% Significant 90% proximal to mid right coronary artery atherosclerosis. The patient will be on DAPT pending the making of further therapeutic decisions.   Consultants  Cardiology  Procedures  LHC   Antibiotics   Anti-infectives (From admission, onward)    None       Subjective  The patient is resting comfortably. No new complaints.  Objective   Vitals:  Vitals:   10/13/20 0900 10/13/20 1142  BP: 102/66 139/74  Pulse: (!) 51 66  Resp: 10 18  Temp:  98.2 F (36.8 C)  SpO2: 96% 98%    Exam:  Constitutional:  The patient is awake, alert, and oriented x 3. No acute distress. Respiratory:  CTA bilaterally, no w/r/r.  Respiratory effort normal. No retractions or accessory muscle  use Cardiovascular:  RRR, no m/r/g No LE extremity edema   Normal pedal pulses Abdomen:  Abdomen appears normal; no tenderness or masses No hernias No HSM Musculoskeletal:  Digits/nails BUE: no clubbing, cyanosis, petechiae, infection exam of joints, bones, muscles of at least one of following: head/neck, RUE, LUE, RLE, LLE   Skin:  No rashes, lesions, ulcers palpation of skin: no induration or nodules Neurologic:  CN 2-12 intact Sensation all 4 extremities intact Psychiatric:  Mental status Mood, affect appropriate Orientation to person, place, time  judgment and insight appear intact    I have personally reviewed the following:   Today's Data  Vitals  Lab Data  Lipid panel, troponins, CBC  Micro Data  None  Imaging  CXR  Cardiology Data  EKG LHC   Scheduled Meds:  [MAR Hold] aspirin  81 mg Oral Daily   [START ON 10/14/2020] aspirin  81 mg Oral Pre-Cath   [MAR Hold] atorvastatin  40 mg Oral Daily   [MAR Hold] folic acid  1 mg Oral Daily   [MAR Hold] LORazepam  0-4 mg Intravenous Q6H   Followed by   [GBT Hold] LORazepam  0-4 mg Intravenous Q12H   [MAR Hold] multivitamin with minerals  1 tablet Oral Daily   [MAR Hold] sodium chloride flush  3 mL Intravenous Q12H   [MAR Hold] thiamine  100 mg Oral Daily   Or   [MAR Hold] thiamine  100 mg Intravenous Daily   Continuous Infusions:  sodium chloride     [  START ON 10/14/2020] sodium chloride 3 mL/kg/hr (10/13/20 1200)   Followed by   Melene Muller ON 10/14/2020] sodium chloride 1 mL/kg/hr (10/13/20 1200)   heparin 1,100 Units/hr (10/13/20 1112)    Principal Problem:   NSTEMI (non-ST elevated myocardial infarction) (HCC) Active Problems:   Alcohol use   LOS: 1 day   A & P  NSTEMI: Troponin 143. S/P LHC demonstrating complicated ostial to proximal LAD calcified with small aneurysmal segment at 60 and 95%. Significant 90% proximal to mid right coronary artery atherosclerosis. I appreciate cardiology's  assistance. Still on heparin drip.  DAPT until further decisions are made per cardiology.  Hyperlipidemia: LDL: 209. Will start Crestor.  Alcohol Use: Noted.  DVT prophylaxis: Heparin drip Code Status: Full Code Family Communication: None available Disposition Plan: Home    Anaiza Behrens, DO [facility] Direct contact: see [email or web address]  7PM-7AM contact night coverage as above 10/13/2020, 4:39 PM  LOS: 1 day

## 2020-10-13 NOTE — Consult Note (Signed)
ANTICOAGULATION CONSULT NOTE - Initial Consult  Pharmacy Consult for heparin  Indication: chest pain/ACS  No Known Allergies  Patient Measurements: Height: 5\' 3"  (160 cm) Weight: 81.6 kg (180 lb) IBW/kg (Calculated) : 56.9 Heparin Dosing Weight: 74.3kg  Vital Signs: BP: 107/66 (10/10 2130) Pulse Rate: 56 (10/10 2130)  Labs: Recent Labs    10/12/20 1205 10/12/20 1312 10/12/20 2208  HGB 16.1  --   --   HCT 45.5  --   --   PLT 144*  --   --   APTT  --  30  --   LABPROT  --  13.5  --   INR  --  1.0  --   HEPARINUNFRC  --   --  0.18*  CREATININE 0.70  --   --   TROPONINIHS 143*  --  164*     Estimated Creatinine Clearance: 106.7 mL/min (by C-G formula based on SCr of 0.7 mg/dL).   Medical History: Past Medical History:  Diagnosis Date   Alcohol use    4-5 times per week, 4-5 beers each time     Assessment: 50yo M with no PMH on file was admitted to the hospital for chest pain. The pharmacy was consulted for heparin dosing.   Baseline PT/INR and aPTT ordered  PLT noted to be 144, otherwise CBC within normal limits   No PTA anticoagulants noted on chart review    Goal of Therapy:  Heparin level 0.3-0.7 units/ml Monitor platelets by anticoagulation protocol: Yes   Plan:  10/10:   HL @ 2208 = 0.18 Will order heparin 2200 units IV X 1 bolus and increase drip rate to 1100 units/hr.  Will recheck HL 6 hrs after rate change.   Mischelle Reeg D, PharmD 10/13/2020 12:08 AM

## 2020-10-13 NOTE — Consult Note (Signed)
ANTICOAGULATION CONSULT NOTE - Initial Consult  Pharmacy Consult for heparin  Indication: chest pain/ACS  No Known Allergies  Patient Measurements: Height: 5\' 3"  (160 cm) Weight: 81.6 kg (180 lb) IBW/kg (Calculated) : 56.9 Heparin Dosing Weight: 74.3kg  Vital Signs: BP: 109/74 (10/11 0555) Pulse Rate: 60 (10/11 0555)  Labs: Recent Labs    10/12/20 1205 10/12/20 1312 10/12/20 2208 10/13/20 0620  HGB 16.1  --   --  15.8  HCT 45.5  --   --  44.0  PLT 144*  --   --  126*  APTT  --  30  --   --   LABPROT  --  13.5  --   --   INR  --  1.0  --   --   HEPARINUNFRC  --   --  0.18* 0.56  CREATININE 0.70  --   --   --   TROPONINIHS 143*  --  164*  --      Estimated Creatinine Clearance: 106.7 mL/min (by C-G formula based on SCr of 0.7 mg/dL).   Medical History: Past Medical History:  Diagnosis Date   Alcohol use    4-5 times per week, 4-5 beers each time     Assessment: 50yo M with no PMH on file was admitted to the hospital for chest pain. The pharmacy was consulted for heparin dosing.   Baseline PT/INR and aPTT ordered  PLT noted to be 144, otherwise CBC within normal limits   No PTA anticoagulants noted on chart review    Goal of Therapy:  Heparin level 0.3-0.7 units/ml Monitor platelets by anticoagulation protocol: Yes   Plan:  10/11:  HL @ 0620 = 0.56, therapeutic X 1  Will continue pt on current rate and draw confirmation level @ 1200.   Ineta Sinning D, PharmD 10/13/2020 7:01 AM

## 2020-10-14 ENCOUNTER — Encounter: Payer: Self-pay | Admitting: Internal Medicine

## 2020-10-14 DIAGNOSIS — I214 Non-ST elevation (NSTEMI) myocardial infarction: Principal | ICD-10-CM

## 2020-10-14 LAB — HEPARIN LEVEL (UNFRACTIONATED)
Heparin Unfractionated: 0.23 IU/mL — ABNORMAL LOW (ref 0.30–0.70)
Heparin Unfractionated: 0.63 IU/mL (ref 0.30–0.70)
Heparin Unfractionated: 0.54 IU/mL (ref 0.30–0.70)

## 2020-10-14 LAB — VITAMIN D 25 HYDROXY (VIT D DEFICIENCY, FRACTURES): Vit D, 25-Hydroxy: 29.23 ng/mL — ABNORMAL LOW (ref 30–100)

## 2020-10-14 LAB — BASIC METABOLIC PANEL
Anion gap: 9 (ref 5–15)
BUN: 16 mg/dL (ref 6–20)
CO2: 23 mmol/L (ref 22–32)
Calcium: 9.2 mg/dL (ref 8.9–10.3)
Chloride: 103 mmol/L (ref 98–111)
Creatinine, Ser: 0.63 mg/dL (ref 0.61–1.24)
GFR, Estimated: >60 mL/min (ref >60)
Glucose, Bld: 101 mg/dL — ABNORMAL HIGH (ref 70–99)
Potassium: 4 mmol/L (ref 3.5–5.1)
Sodium: 135 mmol/L (ref 135–145)

## 2020-10-14 LAB — IRON AND TIBC
Iron: 116 ug/dL (ref 45–182)
Saturation Ratios: 34 % (ref 17.9–39.5)
TIBC: 346 ug/dL (ref 250–450)
UIBC: 230 ug/dL

## 2020-10-14 LAB — CBC
HCT: 41.4 % (ref 39.0–52.0)
Hemoglobin: 14.8 g/dL (ref 13.0–17.0)
MCH: 35.3 pg — ABNORMAL HIGH (ref 26.0–34.0)
MCHC: 35.7 g/dL (ref 30.0–36.0)
MCV: 98.8 fL (ref 80.0–100.0)
Platelets: 121 10*3/uL — ABNORMAL LOW (ref 150–400)
RBC: 4.19 MIL/uL — ABNORMAL LOW (ref 4.22–5.81)
RDW: 12.7 % (ref 11.5–15.5)
WBC: 6.1 10*3/uL (ref 4.0–10.5)
nRBC: 0 % (ref 0.0–0.2)

## 2020-10-14 LAB — PHOSPHORUS: Phosphorus: 4.1 mg/dL (ref 2.5–4.6)

## 2020-10-14 LAB — MAGNESIUM: Magnesium: 2 mg/dL (ref 1.7–2.4)

## 2020-10-14 LAB — FOLATE: Folate: 23 ng/mL (ref >5.9)

## 2020-10-14 LAB — VITAMIN B12: Vitamin B-12: 221 pg/mL (ref 180–914)

## 2020-10-14 MED ORDER — HEPARIN BOLUS VIA INFUSION
2200.0000 [IU] | Freq: Once | INTRAVENOUS | Status: AC
  Administered 2020-10-14: 2200 [IU] via INTRAVENOUS
  Filled 2020-10-14: qty 2200

## 2020-10-14 MED ORDER — ATORVASTATIN CALCIUM 80 MG PO TABS
80.0000 mg | ORAL_TABLET | Freq: Every day | ORAL | Status: DC
  Administered 2020-10-15 – 2020-10-16 (×2): 80 mg via ORAL
  Filled 2020-10-14 (×3): qty 1

## 2020-10-14 MED ORDER — ACETAMINOPHEN 325 MG PO TABS: 650.0000 mg | ORAL_TABLET | ORAL | Status: DC | PRN

## 2020-10-14 NOTE — Consult Note (Signed)
ANTICOAGULATION CONSULT NOTE  Pharmacy Consult for heparin  Indication: chest pain/ACS  No Known Allergies  Patient Measurements: Height: 5\' 3"  (160 cm) Weight: 81.6 kg (180 lb) IBW/kg (Calculated) : 56.9 Heparin Dosing Weight: 74.3kg  Vital Signs: Temp: 98.3 F (36.8 C) (10/12 1527) Temp Source: Oral (10/12 1527) BP: 114/76 (10/12 1527) Pulse Rate: 49 (10/12 1527)  Labs: Recent Labs    10/12/20 1205 10/12/20 1312 10/12/20 2208 10/12/20 2208 10/13/20 0620 10/14/20 0624 10/14/20 1458  HGB 16.1  --   --   --  15.8 14.8  --   HCT 45.5  --   --   --  44.0 41.4  --   PLT 144*  --   --   --  126* 121*  --   APTT  --  30  --   --   --   --   --   LABPROT  --  13.5  --   --   --   --   --   INR  --  1.0  --   --   --   --   --   HEPARINUNFRC  --   --  0.18*   < > 0.56 0.23* 0.63  CREATININE 0.70  --   --   --   --  0.63  --   TROPONINIHS 143*  --  164*  --  147*  --   --    < > = values in this interval not displayed.     Estimated Creatinine Clearance: 106.7 mL/min (by C-G formula based on SCr of 0.63 mg/dL).   Medical History: Past Medical History:  Diagnosis Date   Alcohol use    4-5 times per week, 4-5 beers each time     Assessment: 50yo M with no PMH on file was admitted to the hospital for chest pain. The pharmacy was consulted for heparin.    Date/Time HL Interpretation 10/12 0624 0.23 Subthera, 2200 bolus, 1100>1250 unit/hr 10/12 1458 0.63 Thera x1  No PTA anticoagulants noted on chart review    Goal of Therapy:  Heparin level 0.3-0.7 units/ml Monitor platelets by anticoagulation protocol: Yes   Plan:  -Heparin therapeutic, will maintain current rate of 1250 units/hr -Will recheck HL 6 hrs to confirm therapeutic  -Daily CBC while on heparin    12/12, PharmD Pharmacy Resident  10/14/2020 3:47 PM

## 2020-10-14 NOTE — Consult Note (Signed)
ANTICOAGULATION CONSULT NOTE  Pharmacy Consult for heparin  Indication: chest pain/ACS  No Known Allergies  Patient Measurements: Height: 5\' 3"  (160 cm) Weight: 81.6 kg (180 lb) IBW/kg (Calculated) : 56.9 Heparin Dosing Weight: 74.3kg  Vital Signs: Temp: 98.1 F (36.7 C) (10/12 0450) Temp Source: Oral (10/12 0450) BP: 112/72 (10/12 0450) Pulse Rate: 49 (10/12 0450)  Labs: Recent Labs    10/12/20 1205 10/12/20 1312 10/12/20 2208 10/13/20 0620 10/14/20 0624  HGB 16.1  --   --  15.8 14.8  HCT 45.5  --   --  44.0 41.4  PLT 144*  --   --  126* 121*  APTT  --  30  --   --   --   LABPROT  --  13.5  --   --   --   INR  --  1.0  --   --   --   HEPARINUNFRC  --   --  0.18* 0.56 0.23*  CREATININE 0.70  --   --   --  0.63  TROPONINIHS 143*  --  164* 147*  --      Estimated Creatinine Clearance: 106.7 mL/min (by C-G formula based on SCr of 0.63 mg/dL).   Medical History: Past Medical History:  Diagnosis Date   Alcohol use    4-5 times per week, 4-5 beers each time     Assessment: 50yo M with no PMH on file was admitted to the hospital for chest pain. The pharmacy was consulted for heparin restart 8h after sheath removal post-cath..   Baseline labs PT: 13.5 INR: 1.0 aPTT: 30 PLT 144 Hgb 16.1  No PTA anticoagulants noted on chart review    Goal of Therapy:  Heparin level 0.3-0.7 units/ml Monitor platelets by anticoagulation protocol: Yes   Plan:  10/12:  HL @ 0624 = 0.23 Will order heparin 2200 units IV X 1 bolus and increase drip rate to 1250 units/hr. Will recheck HL 6 hrs after rate change.   -10/14/2020 7:23 AM

## 2020-10-14 NOTE — Progress Notes (Signed)
PROGRESS NOTE  Nathan Nielsen OHY:[id number] DOB: [birth year] DOA: 10/12/2020 PCP: Pcp, No  Brief History   Nathan Nielsen is a 49 y.o. male with medical history significant of alcohol use, who presents with chest pain.   Patient has chest pain for almost a week, which is located in substernal area, intermittent, mild to moderate, pressure-like, exertional, radiating to the jaw, right arm and neck.  Patient also has exertional shortness of breath.  No cough, fever or chills.  Denies nausea, vomiting, diarrhea or abdominal pain.  No symptoms of UTI.  No recent head injury or rectal bleeding or dark stool. Patient states that she drinks beer 4-5 times per week and 4-5 beers each time.   ED Course: pt was found to have troponin level 143, pending COVID-19 PCR, electrolytes renal function okay, temperature normal, blood pressure 144/78, heart rate 71, RR 18, oxygen saturation 99% on room air.  Chest x-ray negative.  Patient is admitted to progressive bed as inpatient.    The patient was admitted to a PCU bed.   Dr. Gwen Pounds of cardiology was consulted. LHC was obtained on 10/12/2020. It demonstrated: Complicated ostial to proximal LAD calcified with small aneurysmal segment at 60 and 95% Significant 90% proximal to mid right coronary artery atherosclerosis. The patient will be on DAPT pending the making of further therapeutic decisions.   Consultants  Cardiology  Procedures  LHC   Antibiotics   Anti-infectives (From admission, onward)    None       Subjective  The patient is resting comfortably. No new complaints.  Objective   Vitals:  Vitals:   10/14/20 1123 10/14/20 1527  BP: 115/74 114/76  Pulse: (!) 49 (!) 49  Resp: 18 18  Temp: 97.8 F (36.6 C) 98.3 F (36.8 C)  SpO2: 97% 97%    Exam:  Constitutional:  The patient is awake, alert, and oriented x 3. No acute distress. Respiratory:  CTA bilaterally, no w/r/r.  Respiratory effort normal. No retractions or  accessory muscle use Cardiovascular:  RRR, no m/r/g No LE extremity edema   Normal pedal pulses Abdomen:  Abdomen appears normal; no tenderness or masses No hernias No HSM Musculoskeletal:  Digits/nails BUE: no clubbing, cyanosis, petechiae, infection exam of joints, bones, muscles of at least one of following: head/neck, RUE, LUE, RLE, LLE   Skin:  No rashes, lesions, ulcers palpation of skin: no induration or nodules Neurologic:  CN 2-12 intact Sensation all 4 extremities intact Psychiatric:  Mental status Mood, affect appropriate Orientation to person, place, time  judgment and insight appear intact    I have personally reviewed the following:   Today's Data  Vitals  Lab Data  Lipid panel, troponins, CBC  Micro Data  None  Imaging  CXR  Cardiology Data  EKG LHC   Scheduled Meds:  aspirin  81 mg Oral Daily   atorvastatin  80 mg Oral Daily   folic acid  1 mg Oral Daily   LORazepam  0-4 mg Intravenous Q12H   multivitamin with minerals  1 tablet Oral Daily   sodium chloride flush  3 mL Intravenous Q12H   thiamine  100 mg Oral Daily   Or   thiamine  100 mg Intravenous Daily   Continuous Infusions:  heparin 1,250 Units/hr (10/14/20 0853)    Principal Problem:   NSTEMI (non-ST elevated myocardial infarction) (HCC) Active Problems:   Alcohol use   LOS: 2 days   A & P  NSTEMI: Troponin 143. S/P LHC  demonstrating complicated ostial to proximal LAD calcified with small aneurysmal segment at 60 and 95%. Significant 90% proximal to mid right coronary artery atherosclerosis. I appreciate cardiology's assistance.  Nitroglycerin as needed Still on heparin drip.   Single antiplatelet therapy due to possible CABG in near future, high intensity statin Patient does not have insurance, patient will need help for coverage and needs to be transferred for CABG, cardiology is following.   Hyperlipidemia: LDL: 209.  Started Lipitor 80 mg daily  Alcohol Use: Noted.   Continue to monitor on CIWA protocol for any withdrawal symptoms Continue folic acid, thiamine  DVT prophylaxis: Heparin drip Code Status: Full Code Family Communication: None available Disposition Plan: Transfer for CABG, patient does not have insurance.  Awaiting for cardiology clearance   Gillis Santa, MD [facility] Direct contact: see [email or web address]  7PM-7AM contact night coverage as above

## 2020-10-14 NOTE — Progress Notes (Signed)
[facility] Cardiology [facility] Encounter Note  Patient: Nathan Nielsen / Admit Date: 10/12/2020 / Date of Encounter: 10/14/2020, 9:01 AM   Subjective: Patient done well overnight with no evidence of chest discomfort with appropriate   medical therapy for non-ST elevation myocardial infarction with peak troponin of 164.  Cardiac catheterization showing normal LV systolic function but significantly complicated two-vessel coronary artery disease with 90% proximal LAD with small aneurysmal segment and 90% mid right coronary artery stenosis.  Long discussion with cardiovascular team including interventional cardiology, cardiology, and cardiovascular surgery suggest that the best long-term outcome treatment/patient will be coronary artery bypass surgery.  The patient has been informed of this and had long discussion and is agreeable at this time.  Review of Systems: Positive for: Normal none Negative for: Vision change, hearing change, syncope, dizziness, nausea, vomiting,diarrhea, bloody stool, stomach pain, cough, congestion, diaphoresis, urinary frequency, urinary pain,skin lesions, skin rashes Others previously listed  Objective: Telemetry: Normal sinus rhythm Physical Exam: Blood pressure 111/71, pulse (!) 52, temperature 97.8 F (36.6 C), temperature source Oral, resp. rate 18, height 5\' 3"  (1.6 m), weight 81.6 kg, SpO2 97 %. Body mass index is 31.89 kg/m. General: Well developed, well nourished, in no acute distress. Head: Normocephalic, atraumatic, sclera non-icteric, no xanthomas, nares are without discharge. Neck: No apparent masses Lungs: Normal respirations with no wheezes, no rhonchi, no rales , no crackles   Heart: Regular rate and rhythm, normal S1 S2, no murmur, no rub, no gallop, PMI is normal size and placement, carotid upstroke normal without bruit, jugular venous pressure normal Abdomen: Soft, non-tender, non-distended with normoactive bowel sounds. No  hepatosplenomegaly. Abdominal aorta is normal size without bruit Extremities: No edema, no clubbing, no cyanosis, no ulcers,  Peripheral: 2+ radial, 2+ femoral, 2+ dorsal pedal pulses Neuro: Alert and oriented. Moves all extremities spontaneously. Psych:  Responds to questions appropriately with a normal affect.   Intake/Output Summary (Last 24 hours) at 10/14/2020 0901 Last data filed at 10/14/2020 0852 Gross per 24 hour  Intake 1391.26 ml  Output --  Net 1391.26 ml    Inpatient Medications:  . aspirin  81 mg Oral Daily  . atorvastatin  40 mg Oral Daily  . folic acid  1 mg Oral Daily  . LORazepam  0-4 mg Intravenous Q6H   Followed by  . LORazepam  0-4 mg Intravenous Q12H  . multivitamin with minerals  1 tablet Oral Daily  . sodium chloride flush  3 mL Intravenous Q12H  . thiamine  100 mg Oral Daily   Or  . thiamine  100 mg Intravenous Daily   Infusions:  . heparin 1,250 Units/hr (10/14/20 0853)    Labs: Recent Labs    10/12/20 1205 10/14/20 0624  NA 135 135  K 3.6 4.0  CL 100 103  CO2 26 23  GLUCOSE 86 101*  BUN 11 16  CREATININE 0.70 0.63  CALCIUM 9.6 9.2    No results for input(s): AST, ALT, ALKPHOS, BILITOT, PROT, ALBUMIN in the last 72 hours. Recent Labs    10/13/20 0620 10/14/20 0624  WBC 6.0 6.1  HGB 15.8 14.8  HCT 44.0 41.4  MCV 97.3 98.8  PLT 126* 121*    No results for input(s): CKTOTAL, CKMB, TROPONINI in the last 72 hours. Invalid input(s): POCBNP Recent Labs    10/12/20 1205  HGBA1C 6.1*      Weights: Filed Weights   10/12/20 1201 10/13/20 1142  Weight: 81.6 kg 81.6 kg     Radiology/Studies:  DG Chest 2 View  Result Date: 10/12/2020 CLINICAL DATA:  Chest pain EXAM: CHEST - 2 VIEW COMPARISON:  None. FINDINGS: The heart size and mediastinal contours are within normal limits. Both lungs are clear. The visualized skeletal structures are unremarkable. IMPRESSION: No active cardiopulmonary disease. Electronically Signed   By:  Wiliam Ke M.D.   On: 10/12/2020 12:46   CARDIAC CATHETERIZATION  Result Date: 10/13/2020 .  Mid RCA lesion is 90% stenosed. Suezanne Jacquet Cx lesion is 35% stenosed. Suezanne Jacquet LAD to Prox LAD lesion is 65% stenosed. .  Prox LAD lesion is 90% stenosed with 95% stenosed side branch in 1st Sept. .  The left ventricular systolic function is normal. .  LV end diastolic pressure is normal. .  The left ventricular ejection fraction is 55-65% by visual estimate. 50 year old male with no history of previous cardiovascular disease and or risk factor for cardiovascular disease but with a high LDL cholesterol of 209 and borderline hypertension.  Hemoglobin A1c has been 6.2.  He came in with classic angina with a troponin elevation of 164. Left ventricle with normal LV systolic function with ejection fraction of 65% Complicated ostial to proximal LAD calcified with small aneurysmal segment at 60 and 95% Significant 90% proximal to mid right coronary artery atherosclerosis Plan High intensity cholesterol therapy Hypertension control Single antiplatelet therapy until further decisions Discussion with patient and family of treatment options including the possibility of PCI and stent placement of 2 vessels versus two-vessel coronary artery bypass grafting Team discussion with tertiary care cardiovascular surgery and interventional cardiology     Assessment and Recommendation  50 y.o. male with hyperlipidemia borderline hypertension having minimal non-ST elevation microinfarction with preserved ejection fraction and significant two-vessel coronary artery disease with some complicated.  His needing further discussion primary treatment of his coronary artery disease and therefore may need coronary artery bypass surgery for significant three-vessel coronary artery disease 1.  High intensity cholesterol therapy 2.  Single antiplatelet therapy due to possible coronary bypass grafting in the near future.  We will continue heparin at  this time 3.  Heparin for further evaluation treatment options of minimal myocardial infarction 4.  No further cardiac diagnostics necessary at this time until further intervention including coronary artery bypass grafting 5.  Patient will need there essential help for the possibility of insurance coverage for the above  Signed, Arnoldo Hooker M.D. FACC

## 2020-10-14 NOTE — Consult Note (Signed)
ANTICOAGULATION CONSULT NOTE  Pharmacy Consult for heparin  Indication: chest pain/ACS  No Known Allergies  Patient Measurements: Height: 5\' 3"  (160 cm) Weight: 81.6 kg (180 lb) IBW/kg (Calculated) : 56.9 Heparin Dosing Weight: 74.3kg  Vital Signs: Temp: 98.3 F (36.8 C) (10/12 1527) Temp Source: Oral (10/12 1527) BP: 114/76 (10/12 1527) Pulse Rate: 49 (10/12 1527)  Labs: Recent Labs    10/12/20 1205 10/12/20 1312 10/12/20 2208 10/12/20 2208 10/13/20 0620 10/14/20 0624 10/14/20 1458 10/14/20 2111  HGB 16.1  --   --   --  15.8 14.8  --   --   HCT 45.5  --   --   --  44.0 41.4  --   --   PLT 144*  --   --   --  126* 121*  --   --   APTT  --  30  --   --   --   --   --   --   LABPROT  --  13.5  --   --   --   --   --   --   INR  --  1.0  --   --   --   --   --   --   HEPARINUNFRC  --   --  0.18*   < > 0.56 0.23* 0.63 0.54  CREATININE 0.70  --   --   --   --  0.63  --   --   TROPONINIHS 143*  --  164*  --  147*  --   --   --    < > = values in this interval not displayed.     Estimated Creatinine Clearance: 106.7 mL/min (by C-G formula based on SCr of 0.63 mg/dL).   Medical History: Past Medical History:  Diagnosis Date   Alcohol use    4-5 times per week, 4-5 beers each time     Assessment: 50yo M with no PMH on file was admitted to the hospital for chest pain. The pharmacy was consulted for heparin.    Date/Time HL Interpretation 10/12 0624 0.23 Subthera, 2200 bolus, 1100>1250 unit/hr 10/12 1458 0.63 Thera x1 10/12 2111 0.54 Thera x 2 No PTA anticoagulants noted on chart review    Goal of Therapy:  Heparin level 0.3-0.7 units/ml Monitor platelets by anticoagulation protocol: Yes   Plan:  -Heparin remains therapeutic, will maintain current rate of 1250 units/hr -Will check  HL daily with AM labs -Daily CBC while on heparin    2112, PharmD, BCPS Clinical Pharmacist   10/14/2020 9:52 PM

## 2020-10-15 LAB — CBC
HCT: 41.2 % (ref 39.0–52.0)
Hemoglobin: 15 g/dL (ref 13.0–17.0)
MCH: 35.9 pg — ABNORMAL HIGH (ref 26.0–34.0)
MCHC: 36.4 g/dL — ABNORMAL HIGH (ref 30.0–36.0)
MCV: 98.6 fL (ref 80.0–100.0)
Platelets: 109 10*3/uL — ABNORMAL LOW (ref 150–400)
RBC: 4.18 MIL/uL — ABNORMAL LOW (ref 4.22–5.81)
RDW: 12.6 % (ref 11.5–15.5)
WBC: 5.4 10*3/uL (ref 4.0–10.5)
nRBC: 0 % (ref 0.0–0.2)

## 2020-10-15 LAB — BASIC METABOLIC PANEL
Anion gap: 7 (ref 5–15)
BUN: 16 mg/dL (ref 6–20)
CO2: 24 mmol/L (ref 22–32)
Calcium: 9.3 mg/dL (ref 8.9–10.3)
Chloride: 104 mmol/L (ref 98–111)
Creatinine, Ser: 0.76 mg/dL (ref 0.61–1.24)
GFR, Estimated: >60 mL/min (ref >60)
Glucose, Bld: 96 mg/dL (ref 70–99)
Potassium: 3.8 mmol/L (ref 3.5–5.1)
Sodium: 135 mmol/L (ref 135–145)

## 2020-10-15 LAB — PHOSPHORUS: Phosphorus: 4.5 mg/dL (ref 2.5–4.6)

## 2020-10-15 LAB — HEPARIN LEVEL (UNFRACTIONATED): Heparin Unfractionated: 0.59 IU/mL (ref 0.30–0.70)

## 2020-10-15 LAB — MAGNESIUM: Magnesium: 2.1 mg/dL (ref 1.7–2.4)

## 2020-10-15 MED ORDER — FOLIC ACID 1 MG PO TABS: 1.0000 mg | ORAL_TABLET | Freq: Every day | ORAL | 0 refills | Status: AC

## 2020-10-15 MED ORDER — VITAMIN D (ERGOCALCIFEROL) 1.25 MG (50000 UNIT) PO CAPS: 50000.0000 [IU] | ORAL_CAPSULE | ORAL | Status: AC

## 2020-10-15 MED ORDER — ATORVASTATIN CALCIUM 80 MG PO TABS: 80.0000 mg | ORAL_TABLET | Freq: Every day | ORAL | Status: AC

## 2020-10-15 MED ORDER — VITAMIN B-12 1000 MCG PO TABS: 500.0000 ug | ORAL_TABLET | Freq: Every day | ORAL | Status: DC

## 2020-10-15 MED ORDER — ASPIRIN 81 MG PO CHEW: 81.0000 mg | CHEWABLE_TABLET | Freq: Every day | ORAL | Status: AC

## 2020-10-15 MED ORDER — THIAMINE HCL 100 MG PO TABS: 100.0000 mg | ORAL_TABLET | Freq: Every day | ORAL | 0 refills | Status: AC

## 2020-10-15 MED ORDER — LORAZEPAM 1 MG PO TABS: 1.0000 mg | ORAL_TABLET | ORAL | Status: DC | PRN

## 2020-10-15 MED ORDER — VITAMIN D (ERGOCALCIFEROL) 1.25 MG (50000 UNIT) PO CAPS
50000.0000 [IU] | ORAL_CAPSULE | ORAL | Status: DC
Start: 2020-10-15 — End: 2020-10-17
  Administered 2020-10-15: 50000 [IU] via ORAL
  Filled 2020-10-15: qty 1

## 2020-10-15 MED ORDER — CYANOCOBALAMIN 500 MCG PO TABS: 500.0000 ug | ORAL_TABLET | Freq: Every day | ORAL | 0 refills | Status: AC

## 2020-10-15 MED ORDER — NITROGLYCERIN 0.4 MG SL SUBL: 0.4000 mg | SUBLINGUAL_TABLET | SUBLINGUAL | 12 refills | Status: AC | PRN

## 2020-10-15 MED ORDER — LORAZEPAM 2 MG/ML IJ SOLN: 1.0000 mg | INTRAMUSCULAR | Status: DC | PRN

## 2020-10-15 MED ORDER — ADULT MULTIVITAMIN W/MINERALS CH: 1.0000 | ORAL_TABLET | Freq: Every day | ORAL | Status: AC

## 2020-10-15 MED ORDER — CYANOCOBALAMIN 1000 MCG/ML IJ SOLN
1000.0000 ug | Freq: Every day | INTRAMUSCULAR | Status: DC
  Administered 2020-10-15 – 2020-10-16 (×2): 1000 ug via INTRAMUSCULAR
  Filled 2020-10-15 (×2): qty 1

## 2020-10-15 NOTE — Discharge Summary (Addendum)
[facility] Discharge Summary   Patient: Nathan Nielsen CZY:[id number]  PCP: Pcp, No  Date of admission: 10/12/2020   Date of discharge:  10/16/2020     Discharge Diagnoses:  Principal Problem:   NSTEMI (non-ST elevated myocardial infarction) [facility]) Active Problems:   Alcohol use   Admitted From: Home Disposition:  [facility] under CT surgery for CABG  Recommendations for Outpatient Follow-up:  PCP: in 1 wk after discharge from [facility] Follow up LABS/TEST:     Diet recommendation: Cardiac diet  Activity: The patient is advised to gradually reintroduce usual activities, as tolerated  Discharge Condition: stable  Code Status: Full code   History of present illness: As per the H and P dictated on admission Hospital Course:  Nathan Nielsen is a 50 y.o. male with medical history significant of alcohol use, who presents with chest pain. Patient has chest pain for almost a week, which is located in substernal area, intermittent, mild to moderate, pressure-like, exertional, radiating to the jaw, right arm and neck.  Patient also has exertional shortness of breath.  No cough, fever or chills.  Denies nausea, vomiting, diarrhea or abdominal pain.  No symptoms of UTI.  No recent head injury or rectal bleeding or dark stool. Patient states that she drinks beer 4-5 times per week and 4-5 beers each time.   ED Course: pt was found to have troponin level 143, pending COVID-19 PCR, electrolytes renal function okay, temperature normal, blood pressure 144/78, heart rate 71, RR 18, oxygen saturation 99% on room air.  Chest x-ray negative.  Patient is admitted to progressive bed as inpatient.   The patient was admitted to a PCU bed.   Assessment and plan NSTEMI: Troponin 143.  Dr. Gwen Pounds of cardiology was consulted. LHC was obtained on 10/12/2020. It demonstrated: Complicated ostial to proximal LAD calcified with small aneurysmal segment at 60 and 95% Significant 90% proximal to mid right  coronary artery atherosclerosis.  Continue Nitroglycerin as needed, Still on heparin drip.   Single antiplatelet therapy due to possible CABG in near future, high intensity statin Cardiologist discussed with CT surgery at [facility] the patient is being discharged for CABG as per cardiology.  Patient is chest pain-free, patient had chest pressure in the morning which has been resolved.    Hyperlipidemia: LDL: 209.  Started Lipitor 80 mg daily   Alcohol Use: Noted.  Continue to monitor on CIWA protocol for any withdrawal symptoms Continue folic acid, thiamine, and MV Started vitamin D and vitamin B12 supplement as well, follow with PCP to repeat vitamin D and vitamin B12 level after 3 to 6 months    Body mass index is 31.89 kg/m.  Nutrition Interventions:    Patient was ambulatory without any assistance. On the day of the discharge the patient's vitals were stable, and no other acute medical condition were reported by patient. the patient was felt safe to be discharge at [facility] for CABG when bed will be available.  Consultants: Cardiology Procedures: s/p LHC   Discharge Exam: General: Appear in no distress, no Rash; Oral Mucosa Clear, moist. Cardiovascular: S1 and S2 Present, no Murmur, Respiratory: normal respiratory effort, Bilateral Air entry present and no Crackles, no wheezes Abdomen: Bowel Sound present, Soft and no tenderness, no hernia Extremities: no Pedal edema, no calf tenderness Neurology: alert and oriented to time, place, and person affect appropriate.  Filed Weights   10/12/20 1201 10/13/20 1142 10/16/20 0900  Weight: 81.6 kg 81.6 kg 81.6 kg   Vitals:  10/16/20 1134 10/16/20 1523  BP: 118/75 124/80  Pulse: 62   Resp: 19 20  Temp: 98.4 F (36.9 C) 98.3 F (36.8 C)  SpO2: 97% 97%    DISCHARGE MEDICATION: Allergies as of 10/16/2020   No Known Allergies      Medication List     TAKE these medications    acetaminophen 325 MG tablet Commonly known as:  TYLENOL Take 650 mg by mouth every 6 (six) hours as needed for mild pain.   aspirin 81 MG chewable tablet Chew 1 tablet (81 mg total) by mouth daily.   atorvastatin 80 MG tablet Commonly known as: LIPITOR Take 1 tablet (80 mg total) by mouth daily.   folic acid 1 MG tablet Commonly known as: FOLVITE Take 1 tablet (1 mg total) by mouth daily.   multivitamin with minerals Tabs tablet Take 1 tablet by mouth daily.   nitroGLYCERIN 0.4 MG SL tablet Commonly known as: NITROSTAT Place 1 tablet (0.4 mg total) under the tongue every 5 (five) minutes as needed for chest pain.   thiamine 100 MG tablet Take 1 tablet (100 mg total) by mouth daily.   vitamin B-12 500 MCG tablet Commonly known as: CYANOCOBALAMIN Take 1 tablet (500 mcg total) by mouth daily. Start taking on: October 22, 2020   Vitamin D (Ergocalciferol) 1.25 MG (50000 UNIT) Caps capsule Commonly known as: DRISDOL Take 1 capsule (50,000 Units total) by mouth every 7 (seven) days. Start taking on: October 22, 2020       No Known Allergies Discharge Instructions     AMB Referral to Cardiac Rehabilitation - Phase II   Complete by: As directed    Diagnosis: NSTEMI   Call MD for:  difficulty breathing, headache or visual disturbances   Complete by: As directed    Call MD for:  severe uncontrolled pain   Complete by: As directed    Call MD for:  temperature >100.4   Complete by: As directed    Diet - low sodium heart healthy   Complete by: As directed    Discharge instructions   Complete by: As directed    Follow-up with PCP and cardiology after discharge from [facility] and follow discharge recommendations   Increase activity slowly   Complete by: As directed        The results of significant diagnostics from this hospitalization (including imaging, microbiology, ancillary and laboratory) are listed below for reference.    Significant Diagnostic Studies: DG Chest 2 View  Result Date: 10/12/2020 CLINICAL DATA:   Chest pain EXAM: CHEST - 2 VIEW COMPARISON:  None. FINDINGS: The heart size and mediastinal contours are within normal limits. Both lungs are clear. The visualized skeletal structures are unremarkable. IMPRESSION: No active cardiopulmonary disease. Electronically Signed   By: Wiliam Ke M.D.   On: 10/12/2020 12:46   CARDIAC CATHETERIZATION  Result Date: 10/13/2020   Mid RCA lesion is 90% stenosed.   Ost Cx lesion is 35% stenosed.   Ost LAD to Prox LAD lesion is 65% stenosed.   Prox LAD lesion is 90% stenosed with 95% stenosed side branch in 1st Sept.   The left ventricular systolic function is normal.   LV end diastolic pressure is normal.   The left ventricular ejection fraction is 55-65% by visual estimate. 50 year old male with no history of previous cardiovascular disease and or risk factor for cardiovascular disease but with a high LDL cholesterol of 209 and borderline hypertension.  Hemoglobin A1c has been 6.2.  He  came in with classic angina with a troponin elevation of 164. Left ventricle with normal LV systolic function with ejection fraction of 65% Complicated ostial to proximal LAD calcified with small aneurysmal segment at 60 and 95% Significant 90% proximal to mid right coronary artery atherosclerosis Plan High intensity cholesterol therapy Hypertension control Single antiplatelet therapy until further decisions Discussion with patient and family of treatment options including the possibility of PCI and stent placement of 2 vessels versus two-vessel coronary artery bypass grafting Team discussion with tertiary care cardiovascular surgery and interventional cardiology    Microbiology: Recent Results (from the past 240 hour(s))  Resp Panel by RT-PCR (Flu A&B, Covid) Nasopharyngeal Swab     Status: None   Collection Time: 10/12/20  1:12 PM   Specimen: Nasopharyngeal Swab; Nasopharyngeal(NP) swabs in vial transport medium  Result Value Ref Range Status   SARS Coronavirus 2 by RT PCR NEGATIVE  NEGATIVE Final    Comment: (NOTE) SARS-CoV-2 target nucleic acids are NOT DETECTED.  The SARS-CoV-2 RNA is generally detectable in upper respiratory specimens during the acute phase of infection. The lowest concentration of SARS-CoV-2 viral copies this assay can detect is 138 copies/mL. A negative result does not preclude SARS-Cov-2 infection and should not be used as the sole basis for treatment or other patient management decisions. A negative result may occur with  improper specimen collection/handling, submission of specimen other than nasopharyngeal swab, presence of viral mutation(s) within the areas targeted by this assay, and inadequate number of viral copies(<138 copies/mL). A negative result must be combined with clinical observations, patient history, and epidemiological information. The expected result is Negative.  Fact Sheet for Patients:  BloggerCourse.com  Fact Sheet for Healthcare Providers:  SeriousBroker.it  This test is no t yet approved or cleared by the [place] FDA and  has been authorized for detection and/or diagnosis of SARS-CoV-2 by FDA under an Emergency Use Authorization (EUA). This EUA will remain  in effect (meaning this test can be used) for the duration of the COVID-19 declaration under Section 564(b)(1) of the Act, 21 U.S.C.section 360bbb-3(b)(1), unless the authorization is terminated  or revoked sooner.       Influenza A by PCR NEGATIVE NEGATIVE Final   Influenza B by PCR NEGATIVE NEGATIVE Final    Comment: (NOTE) The Xpert Xpress SARS-CoV-2/FLU/RSV plus assay is intended as an aid in the diagnosis of influenza from Nasopharyngeal swab specimens and should not be used as a sole basis for treatment. Nasal washings and aspirates are unacceptable for Xpert Xpress SARS-CoV-2/FLU/RSV testing.  Fact Sheet for Patients: BloggerCourse.com  Fact Sheet for Healthcare  Providers: SeriousBroker.it  This test is not yet approved or cleared by the [place] FDA and has been authorized for detection and/or diagnosis of SARS-CoV-2 by FDA under an Emergency Use Authorization (EUA). This EUA will remain in effect (meaning this test can be used) for the duration of the COVID-19 declaration under Section 564(b)(1) of the Act, 21 U.S.C. section 360bbb-3(b)(1), unless the authorization is terminated or revoked.  Performed at [facility], [address] [place], [place] [address]      Labs: CBC: Recent Labs  Lab 10/12/20 1205 10/13/20 0620 10/14/20 0624 10/15/20 0609 10/16/20 0430  WBC 7.9 6.0 6.1 5.4 7.2  HGB 16.1 15.8 14.8 15.0 15.4  HCT 45.5 44.0 41.4 41.2 44.0  MCV 97.4 97.3 98.8 98.6 97.3  PLT 144* 126* 121* 109* 110*   Basic Metabolic Panel: Recent Labs  Lab 10/12/20 1205 10/14/20 0624 10/15/20  0609  NA 135 135 135  K 3.6 4.0 3.8  CL 100 103 104  CO2 26 23 24   GLUCOSE 86 101* 96  BUN 11 16 16   CREATININE 0.70 0.63 0.76  CALCIUM 9.6 9.2 9.3  MG  --  2.0 2.1  PHOS  --  4.1 4.5   Liver Function Tests: No results for input(s): AST, ALT, ALKPHOS, BILITOT, PROT, ALBUMIN in the last 168 hours. No results for input(s): LIPASE, AMYLASE in the last 168 hours. No results for input(s): AMMONIA in the last 168 hours. Cardiac Enzymes: No results for input(s): CKTOTAL, CKMB, CKMBINDEX, TROPONINI in the last 168 hours. BNP (last 3 results) No results for input(s): BNP in the last 8760 hours. CBG: No results for input(s): GLUCAP in the last 168 hours.  Time spent: 35 minutes  Signed:   [facility]  10/16/2020 4:25 PM

## 2020-10-15 NOTE — Consult Note (Signed)
ANTICOAGULATION CONSULT NOTE  Pharmacy Consult for heparin  Indication: chest pain/ACS  No Known Allergies  Patient Measurements: Height: 5\' 3"  (160 cm) Weight: 81.6 kg (180 lb) IBW/kg (Calculated) : 56.9 Heparin Dosing Weight: 74.3kg  Vital Signs: Temp: 98.2 F (36.8 C) (10/13 0050) Temp Source: Oral (10/13 0050) BP: 113/70 (10/13 0050) Pulse Rate: 51 (10/13 0050)  Labs: Recent Labs    10/12/20 1205 10/12/20 1205 10/12/20 1312 10/12/20 2208 10/13/20 0620 10/14/20 0624 10/14/20 1458 10/14/20 2111 10/15/20 0609  HGB 16.1  --   --   --  15.8 14.8  --   --  15.0  HCT 45.5  --   --   --  44.0 41.4  --   --  41.2  PLT 144*  --   --   --  126* 121*  --   --  109*  APTT  --   --  30  --   --   --   --   --   --   LABPROT  --   --  13.5  --   --   --   --   --   --   INR  --   --  1.0  --   --   --   --   --   --   HEPARINUNFRC  --    < >  --  0.18* 0.56 0.23* 0.63 0.54 0.59  CREATININE 0.70  --   --   --   --  0.63  --   --  0.76  TROPONINIHS 143*  --   --  164* 147*  --   --   --   --    < > = values in this interval not displayed.     Estimated Creatinine Clearance: 106.7 mL/min (by C-G formula based on SCr of 0.76 mg/dL).   Medical History: Past Medical History:  Diagnosis Date   Alcohol use    4-5 times per week, 4-5 beers each time     Assessment: 50yo M with no PMH on file was admitted to the hospital for chest pain. The pharmacy was consulted for heparin.    Date/Time HL Interpretation 10/12 0624 0.23 Subthera, 2200 bolus, 1100>1250 unit/hr 10/12 1458 0.63 Thera x1 10/12 2111 0.54 Thera x 2 10/13 0609 0.59 Therapeutic.   No PTA anticoagulants noted on chart review    Goal of Therapy:  Heparin level 0.3-0.7 units/ml Monitor platelets by anticoagulation protocol: Yes   Plan:  Heparin level is therapeutic. Will continue heparin infusion at 1250 units/hr. Recheck heparin level and CBC with AM labs.  Monitor plt - trending down.   11/13,  PharmD, BCPS Clinical Pharmacist   10/15/2020 7:39 AM

## 2020-10-15 NOTE — TOC Initial Note (Signed)
Transition of Care [facility]) - Initial/Assessment Note    Patient Details  Name: Nathan Nielsen MRN: [id number] Date of Birth: [birth year]  Transition of Care [facility]) CM/SW Contact:    Nathan Sam, LCSW Phone Number: 10/15/2020, 12:21 PM  Clinical Narrative:                  CSW consulted for substance abuse resources.   CSW met with patient and son Nathan Nielsen (phone [phone]) at bedside.  Offered substance abuse resources, patient declined. Offered [facility] information to get established with PCP, patient accepted.   Son reports the are looking at getting patient insurance through a managed plan to make sure he gets the procedure he needs, reports he understands they are waiting on a bed for patient's procedure. Son requests to speak to financial counselor for any updates on [place] application, reports they are in process of providing requested documentation. CSW has Advice worker.   No other discharge needs identified at this time.   Expected Discharge Plan: Acute to Acute Transfer Barriers to Discharge:  (pending bed for procedure)   Patient Goals and CMS Choice   CMS Medicare.gov Compare Post Acute Care list provided to:: Patient Choice offered to / list presented to : Patient  Expected Discharge Plan and Services Expected Discharge Plan: Acute to Acute Transfer                                              Prior Living Arrangements/Services   Lives with:: Self                   Activities of Daily Living Home Assistive Devices/Equipment: None ADL Screening (condition at time of admission) Patient's cognitive ability adequate to safely complete daily activities?: Yes Is the patient deaf or have difficulty hearing?: No Does the patient have difficulty seeing, even when wearing glasses/contacts?: No Does the patient have difficulty concentrating, remembering, or making decisions?: No Patient able to express need for assistance  with ADLs?: Yes Does the patient have difficulty dressing or bathing?: No Independently performs ADLs?: Yes (appropriate for developmental age) Does the patient have difficulty walking or climbing stairs?: No Weakness of Legs: None Weakness of Arms/Hands: None  Permission Sought/Granted                  Emotional Assessment       Orientation: : Oriented to Self, Oriented to Place, Oriented to  Time, Oriented to Situation      Admission diagnosis:  NSTEMI (non-ST elevated myocardial infarction) ([place]) [I21.4] Chest pain, unspecified type [R07.9] Patient Active Problem List   Diagnosis Date Noted   NSTEMI (non-ST elevated myocardial infarction) ([place]) 10/12/2020   Alcohol use 10/12/2020   PCP:  Nathan Nielsen, No Pharmacy:   [facility] [address] ([address]), [place] [address] [place]) [place] [address] Phone: [phone] Fax: [phone]     Social Determinants of Health (SDOH) Interventions    Readmission Risk Interventions No flowsheet data found.

## 2020-10-15 NOTE — Progress Notes (Signed)
[facility] Encounter Note  Patient: Nathan Nielsen / Admit Date: 10/12/2020 / Date of Encounter: 10/15/2020, 1:04 PM   Subjective: Patient done well overnight with no evidence of chest discomfort with appropriate   medical therapy for non-ST elevation myocardial infarction with peak troponin of 164.  Cardiac catheterization showing normal LV systolic function but significantly complicated two-vessel coronary artery disease with 90% ostial to proximal LAD with small aneurysmal segment takeoff of diagonal 1 and 90% mid right coronary artery stenosis.  Long discussion with cardiovascular team including interventional cardiology, cardiology, and cardiovascular surgery suggest that the best long-term outcome treatment/patient will be coronary artery bypass surgery.  The patient has been informed of this and had long discussion and is agreeable at this time.  Review of Systems: Positive for: Normal none Negative for: Vision change, hearing change, syncope, dizziness, nausea, vomiting,diarrhea, bloody stool, stomach pain, cough, congestion, diaphoresis, urinary frequency, urinary pain,skin lesions, skin rashes Others previously listed  Objective: Telemetry: Normal sinus rhythm Physical Exam: Blood pressure 137/81, pulse (!) 59, temperature 97.9 F (36.6 C), resp. rate 18, height 5\' 3"  (1.6 m), weight 81.6 kg, SpO2 100 %. Body mass index is 31.89 kg/m. General: Well developed, well nourished, in no acute distress. Head: Normocephalic, atraumatic, sclera non-icteric, no xanthomas, nares are without discharge. Neck: No apparent masses Lungs: Normal respirations with no wheezes, no rhonchi, no rales , no crackles   Heart: Regular rate and rhythm, normal S1 S2, no murmur, no rub, no gallop, PMI is normal size and placement, carotid upstroke normal without bruit, jugular venous pressure normal Abdomen: Soft, non-tender, non-distended with normoactive bowel sounds. No  hepatosplenomegaly. Abdominal aorta is normal size without bruit Extremities: No edema, no clubbing, no cyanosis, no ulcers,  Peripheral: 2+ radial, 2+ femoral, 2+ dorsal pedal pulses Neuro: Alert and oriented. Moves all extremities spontaneously. Psych:  Responds to questions appropriately with a normal affect.   Intake/Output Summary (Last 24 hours) at 10/15/2020 1304 Last data filed at 10/15/2020 0950 Gross per 24 hour  Intake 478.65 ml  Output 0 ml  Net 478.65 ml     Inpatient Medications:  . aspirin  81 mg Oral Daily  . atorvastatin  80 mg Oral Daily  . cyanocobalamin  1,000 mcg Intramuscular Daily  . folic acid  1 mg Oral Daily  . multivitamin with minerals  1 tablet Oral Daily  . sodium chloride flush  3 mL Intravenous Q12H  . thiamine  100 mg Oral Daily   Or  . thiamine  100 mg Intravenous Daily  . [START ON 10/22/2020] vitamin B-12  500 mcg Oral Daily  . Vitamin D (Ergocalciferol)  50,000 Units Oral Q7 days   Infusions:  . heparin 1,250 Units/hr (10/14/20 1809)    Labs: Recent Labs    10/14/20 0624 10/15/20 0609  NA 135 135  K 4.0 3.8  CL 103 104  CO2 23 24  GLUCOSE 101* 96  BUN 16 16  CREATININE 0.63 0.76  CALCIUM 9.2 9.3  MG 2.0 2.1  PHOS 4.1 4.5    No results for input(s): AST, ALT, ALKPHOS, BILITOT, PROT, ALBUMIN in the last 72 hours. Recent Labs    10/14/20 0624 10/15/20 0609  WBC 6.1 5.4  HGB 14.8 15.0  HCT 41.4 41.2  MCV 98.8 98.6  PLT 121* 109*    No results for input(s): CKTOTAL, CKMB, TROPONINI in the last 72 hours. Invalid input(s): POCBNP No results for input(s): HGBA1C in the last 72 hours.  Weights: Filed Weights   10/12/20 1201 10/13/20 1142  Weight: 81.6 kg 81.6 kg     Radiology/Studies:  DG Chest 2 View  Result Date: 10/12/2020 CLINICAL DATA:  Chest pain EXAM: CHEST - 2 VIEW COMPARISON:  None. FINDINGS: The heart size and mediastinal contours are within normal limits. Both lungs are clear. The visualized  skeletal structures are unremarkable. IMPRESSION: No active cardiopulmonary disease. Electronically Signed   By: Wiliam Ke M.D.   On: 10/12/2020 12:46   CARDIAC CATHETERIZATION  Result Date: 10/13/2020 .  Mid RCA lesion is 90% stenosed. Suezanne Jacquet Cx lesion is 35% stenosed. Suezanne Jacquet LAD to Prox LAD lesion is 65% stenosed. .  Prox LAD lesion is 90% stenosed with 95% stenosed side branch in 1st Sept. .  The left ventricular systolic function is normal. .  LV end diastolic pressure is normal. .  The left ventricular ejection fraction is 55-65% by visual estimate. 50 year old male with no history of previous cardiovascular disease and or risk factor for cardiovascular disease but with a high LDL cholesterol of 209 and borderline hypertension.  Hemoglobin A1c has been 6.2.  He came in with classic angina with a troponin elevation of 164. Left ventricle with normal LV systolic function with ejection fraction of 65% Complicated ostial to proximal LAD calcified with small aneurysmal segment at 60 and 95% Significant 90% proximal to mid right coronary artery atherosclerosis Plan High intensity cholesterol therapy Hypertension control Single antiplatelet therapy until further decisions Discussion with patient and family of treatment options including the possibility of PCI and stent placement of 2 vessels versus two-vessel coronary artery bypass grafting Team discussion with tertiary care cardiovascular surgery and interventional cardiology     Assessment and Recommendation  50 y.o. male with hyperlipidemia borderline hypertension having minimal non-ST elevation microinfarction with preserved ejection fraction and significant two-vessel coronary artery disease with some complicated.  His needing further discussion primary treatment of his coronary artery disease and therefore may need coronary artery bypass surgery for significant three-vessel coronary artery disease 1.  High intensity cholesterol therapy 2.  Single  antiplatelet therapy due to possible coronary bypass grafting in the near future.  We will continue heparin at this time 3.  Heparin for further evaluation treatment options of minimal myocardial infarction 4.  No further cardiac diagnostics necessary at this time until further intervention including coronary artery bypass grafting 5.  Plan for transfer to [facility] for above  Signed, Arnoldo Hooker M.D. FACC

## 2020-10-15 NOTE — Progress Notes (Signed)
Patient  was administered Vitamin D and B 12 at noon. Complains an hour later of symptoms he believed started after medications were given: agitation,anxiety, hand tremors and sweating. No pain. Doctor informed.   Doctor recommended CIWA protocol be started.

## 2020-10-16 LAB — RESP PANEL BY RT-PCR (FLU A&B, COVID) ARPGX2
Influenza A by PCR: NEGATIVE
Influenza B by PCR: NEGATIVE
SARS Coronavirus 2 by RT PCR: NEGATIVE

## 2020-10-16 LAB — CBC
HCT: 44 % (ref 39.0–52.0)
Hemoglobin: 15.4 g/dL (ref 13.0–17.0)
MCH: 34.1 pg — ABNORMAL HIGH (ref 26.0–34.0)
MCHC: 35 g/dL (ref 30.0–36.0)
MCV: 97.3 fL (ref 80.0–100.0)
Platelets: 110 10*3/uL — ABNORMAL LOW (ref 150–400)
RBC: 4.52 MIL/uL (ref 4.22–5.81)
RDW: 12.4 % (ref 11.5–15.5)
WBC: 7.2 10*3/uL (ref 4.0–10.5)
nRBC: 0 % (ref 0.0–0.2)

## 2020-10-16 LAB — HEPARIN LEVEL (UNFRACTIONATED)
Heparin Unfractionated: 0.79 IU/mL — ABNORMAL HIGH (ref 0.30–0.70)
Heparin Unfractionated: 0.73 IU/mL — ABNORMAL HIGH (ref 0.30–0.70)

## 2020-10-16 NOTE — Consult Note (Signed)
ANTICOAGULATION CONSULT NOTE  Pharmacy Consult for heparin  Indication: chest pain/ACS  No Known Allergies  Patient Measurements: Height: 5\' 3"  (160 cm) Weight: 81.6 kg (179 lb 14.3 oz) IBW/kg (Calculated) : 56.9 Heparin Dosing Weight: 74.3kg  Vital Signs: Temp: 98.4 F (36.9 C) (10/14 1134) Temp Source: Oral (10/14 1134) BP: 118/75 (10/14 1134) Pulse Rate: 62 (10/14 1134)  Labs: Recent Labs    10/14/20 0624 10/14/20 1458 10/15/20 0609 10/16/20 0430 10/16/20 1405  HGB 14.8  --  15.0 15.4  --   HCT 41.4  --  41.2 44.0  --   PLT 121*  --  109* 110*  --   HEPARINUNFRC 0.23*   < > 0.59 0.79* 0.73*  CREATININE 0.63  --  0.76  --   --    < > = values in this interval not displayed.     Estimated Creatinine Clearance: 106.7 mL/min (by C-G formula based on SCr of 0.76 mg/dL).   Medical History: Past Medical History:  Diagnosis Date   Alcohol use    4-5 times per week, 4-5 beers each time     Assessment: 50yo M with no PMH on file was admitted to the hospital for chest pain. The pharmacy was consulted for heparin.    Date/Time HL Interpretation 10/12 0624 0.23 Subthera, 2200 bolus, 1100>1250 unit/hr 10/12 1458 0.63 Thera x1 10/12 2111 0.54 Thera x 2 10/13 0609 0.59 Therapeutic.  10/14  0430    0.79     Supratherapeutic  10/14 1405 0.73 Supratherapeutic. Decrease to 1000 units/hr.   No PTA anticoagulants noted on chart review    Goal of Therapy:  Heparin level 0.3-0.7 units/ml Monitor platelets by anticoagulation protocol: Yes   Plan:  Heparin level is slightly supratherapeutic. Will decrease the heparin infusion to 1000 units/hr. Recheck heparin level in 6 hours. CBC daily while on heparin.   11/14, PharmD Clinical Pharmacist   10/16/2020 2:42 PM

## 2020-10-16 NOTE — Progress Notes (Signed)
Pt c/o of bleeding in mouth/upon assessment, small amount of blood noted to left side gumline/MD made aware. Orders for decrease in hep gtt. Pt encouraged to alert RN if bleeding worsens. Will continue to monitor.

## 2020-10-16 NOTE — Progress Notes (Signed)
50 year old male. Admitted for a NSTEMI. Awaiting a bed in [facility] for a CABG. Nurse contacted  today. Report given to Nurse. Patient's vitals were stable.   Contact [facility] at [phone] xt 2 for Trinna Post (Nurse who took report). Will call again once a bed is available.  Patient's son requested to talk to Public affairs consultant and coverage. Message sent to Child psychotherapist.  Patient administered medications this morning and vitals taken before administration. All vital signs were normal and HR was baseline at Sinus Huston Foley.   Patient was Alert and Oriented by 4, no sweating, agitation, anxiety or tremors. Patient had these symptoms yesterday, and believes they were brought on from medications he was given at the time. Physician requested CIWA protocol started. After administration of medications today, patient is stable an hour later.  Will continue to monitor.

## 2020-10-16 NOTE — Progress Notes (Signed)
[facility] called, possible bed availability. Pt needs update covid PCR/MD contacted to order. Will swab when orders are placed and update [facility] with results when available.

## 2020-10-16 NOTE — Progress Notes (Signed)
Bed assignment at [facility]- [address] room 3707 Bed 1/PT made aware/ [facility] scheduled with [facility] reported it will be later this evening. Report attempted/RN busy at this time will call back for report. Will report off to night shift RN.

## 2020-10-16 NOTE — Consult Note (Signed)
ANTICOAGULATION CONSULT NOTE  Pharmacy Consult for heparin  Indication: chest pain/ACS  No Known Allergies  Patient Measurements: Height: 5\' 3"  (160 cm) Weight: 81.6 kg (180 lb) IBW/kg (Calculated) : 56.9 Heparin Dosing Weight: 74.3kg  Vital Signs: Temp: 98 F (36.7 C) (10/14 0600) Temp Source: Oral (10/14 0600) BP: 113/77 (10/14 0600) Pulse Rate: 52 (10/14 0600)  Labs: Recent Labs    10/14/20 0624 10/14/20 1458 10/14/20 2111 10/15/20 0609 10/16/20 0430  HGB 14.8  --   --  15.0 15.4  HCT 41.4  --   --  41.2 44.0  PLT 121*  --   --  109* 110*  HEPARINUNFRC 0.23*   < > 0.54 0.59 0.79*  CREATININE 0.63  --   --  0.76  --    < > = values in this interval not displayed.     Estimated Creatinine Clearance: 106.7 mL/min (by C-G formula based on SCr of 0.76 mg/dL).   Medical History: Past Medical History:  Diagnosis Date   Alcohol use    4-5 times per week, 4-5 beers each time     Assessment: 50yo M with no PMH on file was admitted to the hospital for chest pain. The pharmacy was consulted for heparin.    Date/Time HL Interpretation 10/12 0624 0.23 Subthera, 2200 bolus, 1100>1250 unit/hr 10/12 1458 0.63 Thera x1 10/12 2111 0.54 Thera x 2 10/13 0609 0.59 Therapeutic.  10/14  0430    0.79     Supratherapeutic   No PTA anticoagulants noted on chart review    Goal of Therapy:  Heparin level 0.3-0.7 units/ml Monitor platelets by anticoagulation protocol: Yes   Plan:  10/14:  HL @ 0430 = 0.79 Will decrease heparin drip rate to 1150 units/hr and recheck HL 6 hrs after rate change.   Clinical Pharmacist   10/16/2020 7:07 AM

## 2020-10-16 NOTE — Progress Notes (Signed)
Patient left floor with [facility], transferring to [facility] for CABG. Paperwork sent with patient, belongings sent home with family.  Sommer Spickard DNP 2020 PM

## 2020-10-16 NOTE — Progress Notes (Signed)
PROGRESS NOTE  Azure Barrales NOI:[id number] DOB: Nov 22, 1970 DOA: 10/12/2020 PCP: Pcp, No  Brief History   Kendall Aztlan Coll is a 50 y.o. male with medical history significant of alcohol use, who presents with chest pain.   Patient has chest pain for almost a week, which is located in substernal area, intermittent, mild to moderate, pressure-like, exertional, radiating to the jaw, right arm and neck.  Patient also has exertional shortness of breath.  No cough, fever or chills.  Denies nausea, vomiting, diarrhea or abdominal pain.  No symptoms of UTI.  No recent head injury or rectal bleeding or dark stool. Patient states that she drinks beer 4-5 times per week and 4-5 beers each time.   ED Course: pt was found to have troponin level 143, pending COVID-19 PCR, electrolytes renal function okay, temperature normal, blood pressure 144/78, heart rate 71, RR 18, oxygen saturation 99% on room air.  Chest x-ray negative.  Patient is admitted to progressive bed as inpatient.    The patient was admitted to a PCU bed.   Dr. Gwen Pounds of cardiology was consulted. LHC was obtained on 10/12/2020. It demonstrated: Complicated ostial to proximal LAD calcified with small aneurysmal segment at 60 and 95% Significant 90% proximal to mid right coronary artery atherosclerosis. The patient will be on DAPT pending the making of further therapeutic decisions.   Consultants  Cardiology  Procedures  LHC   Antibiotics   Anti-infectives (From admission, onward)    None       Subjective  The patient is resting comfortably. No new complaints.  Objective   Vitals:  Vitals:   10/16/20 1134 10/16/20 1523  BP: 118/75 124/80  Pulse: 62   Resp: 19 20  Temp: 98.4 F (36.9 C) 98.3 F (36.8 C)  SpO2: 97% 97%    Exam:  Constitutional:  The patient is awake, alert, and oriented x 3. No acute distress. Respiratory:  CTA bilaterally, no w/r/r.  Respiratory effort normal. No retractions or accessory  muscle use Cardiovascular:  RRR, no m/r/g No LE extremity edema   Normal pedal pulses Abdomen:  Abdomen appears normal; no tenderness or masses No hernias No HSM Musculoskeletal:  Digits/nails BUE: no clubbing, cyanosis, petechiae, infection exam of joints, bones, muscles of at least one of following: head/neck, RUE, LUE, RLE, LLE   Skin:  No rashes, lesions, ulcers palpation of skin: no induration or nodules Neurologic:  CN 2-12 intact Sensation all 4 extremities intact Psychiatric:  Mental status Mood, affect appropriate Orientation to person, place, time  judgment and insight appear intact    I have personally reviewed the following:   Today's Data  Vitals  Lab Data  Lipid panel, troponins, CBC  Micro Data  None  Imaging  CXR  Cardiology Data  EKG LHC   Scheduled Meds:  aspirin  81 mg Oral Daily   atorvastatin  80 mg Oral Daily   cyanocobalamin  1,000 mcg Intramuscular Daily   folic acid  1 mg Oral Daily   multivitamin with minerals  1 tablet Oral Daily   sodium chloride flush  3 mL Intravenous Q12H   thiamine  100 mg Oral Daily   Or   thiamine  100 mg Intravenous Daily   [START ON 10/22/2020] vitamin B-12  500 mcg Oral Daily   Vitamin D (Ergocalciferol)  50,000 Units Oral Q7 days   Continuous Infusions:  heparin 1,150 Units/hr (10/16/20 1045)    Principal Problem:   NSTEMI (non-ST elevated myocardial infarction) ([facility]) Active  Problems:   Alcohol use   LOS: 4 days   A & P  NSTEMI: Troponin 143. S/P LHC demonstrating complicated ostial to proximal LAD calcified with small aneurysmal segment at 60 and 95%. Significant 90% proximal to mid right coronary artery atherosclerosis. I appreciate cardiology's assistance.  Nitroglycerin as needed Still on heparin drip.   Single antiplatelet therapy due to possible CABG in near future, high intensity statin Patient does not have insurance, patient will need help for coverage and needs to be transferred  for CABG, cardiology is following.   Hyperlipidemia: LDL: 209.  Started Lipitor 80 mg daily  Alcohol Use: Noted.  Continue to monitor on CIWA protocol for any withdrawal symptoms Continue folic acid, thiamine  Cardiology arranged transport to [facility], awaiting for bed availability.  Patient will be discharged to [facility] when bed is available.    DVT prophylaxis: Heparin drip Code Status: Full Code Family Communication: None available Disposition Plan: Transfer for CABG, patient does not have insurance.  Awaiting for cardiology clearance   Gillis Santa, MD [facility] Direct contact: see [email or web address]  7PM-7AM contact night coverage as above

## 2020-10-16 NOTE — TOC Progression Note (Signed)
Transition of Care [facility]) - Progression Note    Patient Details  Name: Harold Moncus MRN: [id number] Date of Birth: 1970-04-08  [facility] CM/SW Contact  Liliana Cline, LCSW Phone Number: 10/16/2020, 12:45 PM  Clinical Narrative:   Notified financial counseling that patient's family is here with paperwork and insurance questions.     Expected Discharge Plan: Acute to Acute Transfer Barriers to Discharge:  (pending bed for procedure)  Expected Discharge Plan and Services Expected Discharge Plan: Acute to Acute Transfer         Expected Discharge Date: 10/15/20                                     Social Determinants of Health (SDOH) Interventions    Readmission Risk Interventions No flowsheet data found.

## 2020-10-16 NOTE — Progress Notes (Signed)
[facility] Encounter Note  Patient: Nathan Nielsen / Admit Date: 10/12/2020 / Date of Encounter: 10/16/2020, 8:44 AM   Subjective: Patient has done well overnight with no evidence of chest discomfort with appropriate   medical therapy for non-ST elevation myocardial infarction with peak troponin of 164.  Cardiac catheterization showing normal LV systolic function but significantly complicated two-vessel coronary artery disease with 90% ostial to proximal LAD with small aneurysmal segment takeoff of diagonal 1 and 90% mid right coronary artery stenosis.  Long discussion with cardiovascular team including interventional cardiology, cardiology, and cardiovascular surgery suggest that the best long-term outcome treatment/patient will be coronary artery bypass surgery.  The patient has been informed of this and had long discussion and is agreeable at this time.  Patient has been accepted at [facility] and awaiting transfer. Pt is comfortable denying any chest pain, shortness of breath, or other discomfort at this time.   Review of Systems: Positive for: none Negative for: Vision change, hearing change, syncope, dizziness, nausea, vomiting,diarrhea, bloody stool, stomach pain, cough, congestion, diaphoresis, urinary frequency, urinary pain,skin lesions, skin rashes Others previously listed  Objective: Telemetry: Sinus Bradycardia Physical Exam: Blood pressure 113/77, pulse (!) 52, temperature 98 F (36.7 C), temperature source Oral, resp. rate 20, height 5\' 3"  (1.6 m), weight 81.6 kg, SpO2 99 %. Body mass index is 31.89 kg/m. General: Well developed, well nourished, in no acute distress. Head: Normocephalic, atraumatic, sclera non-icteric, no xanthomas, nares are without discharge. Neck: No apparent masses Lungs: Normal respirations with no wheezes, no rhonchi, no rales , no crackles   Heart: Regular rate and rhythm, normal S1 S2, no murmur, no rub, no gallop, PMI is  normal size and placement, carotid upstroke normal without bruit, jugular venous pressure normal Abdomen: Soft, non-tender, non-distended with normoactive bowel sounds. No hepatosplenomegaly. Abdominal aorta is normal size without bruit Extremities: No edema, no clubbing, no cyanosis, no ulcers,  Peripheral: 2+ radial, 2+ femoral, 2+ dorsal pedal pulses Neuro: Alert and oriented. Moves all extremities spontaneously. Psych:  Responds to questions appropriately with a normal affect.   Intake/Output Summary (Last 24 hours) at 10/16/2020 0844 Last data filed at 10/16/2020 0700 Gross per 24 hour  Intake 1010.76 ml  Output --  Net 1010.76 ml     Inpatient Medications:  . aspirin  81 mg Oral Daily  . atorvastatin  80 mg Oral Daily  . cyanocobalamin  1,000 mcg Intramuscular Daily  . folic acid  1 mg Oral Daily  . multivitamin with minerals  1 tablet Oral Daily  . sodium chloride flush  3 mL Intravenous Q12H  . thiamine  100 mg Oral Daily   Or  . thiamine  100 mg Intravenous Daily  . [START ON 10/22/2020] vitamin B-12  500 mcg Oral Daily  . Vitamin D (Ergocalciferol)  50,000 Units Oral Q7 days   Infusions:  . heparin 1,150 Units/hr (10/16/20 0724)    Labs: Recent Labs    10/14/20 0624 10/15/20 0609  NA 135 135  K 4.0 3.8  CL 103 104  CO2 23 24  GLUCOSE 101* 96  BUN 16 16  CREATININE 0.63 0.76  CALCIUM 9.2 9.3  MG 2.0 2.1  PHOS 4.1 4.5    No results for input(s): AST, ALT, ALKPHOS, BILITOT, PROT, ALBUMIN in the last 72 hours. Recent Labs    10/15/20 0609 10/16/20 0430  WBC 5.4 7.2  HGB 15.0 15.4  HCT 41.2 44.0  MCV 98.6 97.3  PLT 109* 110*  No results for input(s): CKTOTAL, CKMB, TROPONINI in the last 72 hours. Invalid input(s): POCBNP No results for input(s): HGBA1C in the last 72 hours.    Weights: Filed Weights   10/12/20 1201 10/13/20 1142  Weight: 81.6 kg 81.6 kg     Radiology/Studies:  DG Chest 2 View  Result Date: 10/12/2020 CLINICAL DATA:   Chest pain EXAM: CHEST - 2 VIEW COMPARISON:  None. FINDINGS: The heart size and mediastinal contours are within normal limits. Both lungs are clear. The visualized skeletal structures are unremarkable. IMPRESSION: No active cardiopulmonary disease. Electronically Signed   By: Wiliam Ke M.D.   On: 10/12/2020 12:46   CARDIAC CATHETERIZATION  Result Date: 10/13/2020 .  Mid RCA lesion is 90% stenosed. Suezanne Jacquet Cx lesion is 35% stenosed. Suezanne Jacquet LAD to Prox LAD lesion is 65% stenosed. .  Prox LAD lesion is 90% stenosed with 95% stenosed side branch in 1st Sept. .  The left ventricular systolic function is normal. .  LV end diastolic pressure is normal. .  The left ventricular ejection fraction is 55-65% by visual estimate. 50 year old male with no history of previous cardiovascular disease and or risk factor for cardiovascular disease but with a high LDL cholesterol of 209 and borderline hypertension.  Hemoglobin A1c has been 6.2.  He came in with classic angina with a troponin elevation of 164. Left ventricle with normal LV systolic function with ejection fraction of 65% Complicated ostial to proximal LAD calcified with small aneurysmal segment at 60 and 95% Significant 90% proximal to mid right coronary artery atherosclerosis Plan High intensity cholesterol therapy Hypertension control Single antiplatelet therapy until further decisions Discussion with patient and family of treatment options including the possibility of PCI and stent placement of 2 vessels versus two-vessel coronary artery bypass grafting Team discussion with tertiary care cardiovascular surgery and interventional cardiology     Assessment and Recommendation  50 y.o. male with hyperlipidemia borderline hypertension having minimal non-ST elevation microinfarction with preserved ejection fraction and significant two-vessel coronary artery disease with some complicated.  His needing further discussion primary treatment of his coronary artery  disease and therefore may need coronary artery bypass surgery for significant three-vessel coronary artery disease  Plan: -Continue high intensity cholesterol therapy -Single antiplatelet therapy due to possible coronary bypass grafting in the near future.  We will continue heparin at this time -Continue heparin for further evaluation treatment options of minimal myocardial infarction -No further cardiac diagnostics necessary at this time until further intervention including coronary artery bypass grafting -Plan for transfer to [facility] for above -Dr. Darrold Junker will be on call this weekend and can answer any additional questions there may be while awaiting transfer  Signed, Maralyn Sago, PA-C

## 2020-10-16 NOTE — Progress Notes (Signed)
Heparin levels to be maintained between 0.3 and 0.7. Heparin rate last night of 12.5 reduced to 11.5 for lab values showing 0.79 in previous shift. Will continue to monitor.

## 2022-01-12 ENCOUNTER — Other Ambulatory Visit: Payer: Self-pay | Admitting: Family Medicine

## 2022-01-12 DIAGNOSIS — K7460 Unspecified cirrhosis of liver: Secondary | ICD-10-CM

## 2022-01-18 ENCOUNTER — Ambulatory Visit
Admission: RE | Admit: 2022-01-18 | Discharge: 2022-01-18 | Disposition: A | Discharge status: Home / Self Care | Payer: BLUE CROSS/BLUE SHIELD | Source: Ambulatory Visit | Attending: Family Medicine | Admitting: Family Medicine

## 2022-01-18 DIAGNOSIS — K7460 Unspecified cirrhosis of liver: Secondary | ICD-10-CM | POA: Insufficient documentation

## 2022-02-27 ENCOUNTER — Emergency Department: Payer: BLUE CROSS/BLUE SHIELD

## 2022-02-27 ENCOUNTER — Other Ambulatory Visit: Payer: Self-pay

## 2022-02-27 DIAGNOSIS — I10 Essential (primary) hypertension: Secondary | ICD-10-CM | POA: Diagnosis not present

## 2022-02-27 DIAGNOSIS — R0789 Other chest pain: Secondary | ICD-10-CM | POA: Insufficient documentation

## 2022-02-27 LAB — BASIC METABOLIC PANEL WITH GFR
Anion gap: 10 (ref 5–15)
BUN: 16 mg/dL (ref 6–20)
CO2: 24 mmol/L (ref 22–32)
Calcium: 9.3 mg/dL (ref 8.9–10.3)
Chloride: 100 mmol/L (ref 98–111)
Creatinine, Ser: 0.82 mg/dL (ref 0.61–1.24)
GFR, Estimated: >60 mL/min
Glucose, Bld: 96 mg/dL (ref 70–99)
Potassium: 3.6 mmol/L (ref 3.5–5.1)
Sodium: 134 mmol/L — ABNORMAL LOW (ref 135–145)

## 2022-02-27 LAB — CBC
HCT: 39.9 % (ref 39.0–52.0)
Hemoglobin: 13.4 g/dL (ref 13.0–17.0)
MCH: 32.6 pg (ref 26.0–34.0)
MCHC: 33.6 g/dL (ref 30.0–36.0)
MCV: 97.1 fL (ref 80.0–100.0)
Platelets: 138 K/uL — ABNORMAL LOW (ref 150–400)
RBC: 4.11 MIL/uL — ABNORMAL LOW (ref 4.22–5.81)
RDW: 13.8 % (ref 11.5–15.5)
WBC: 6.8 K/uL (ref 4.0–10.5)
nRBC: 0 % (ref 0.0–0.2)

## 2022-02-27 LAB — TROPONIN I (HIGH SENSITIVITY): Troponin I (High Sensitivity): 6 ng/L (ref <18)

## 2022-02-27 NOTE — ED Triage Notes (Signed)
Pt arrives via POV with CC of chest pain. Reports pain has been intermittent for the last four days but has remained constant throughout today. Denies radiation, shortness of breath, dizziness, lightheadedness, and nausea/vomiting. Pt is alert and oriented at this time - currently appears to be in no acute distress.

## 2022-02-28 ENCOUNTER — Emergency Department
Admission: EM | Admit: 2022-02-28 | Discharge: 2022-02-28 | Disposition: A | Discharge status: Home / Self Care | Payer: BLUE CROSS/BLUE SHIELD | Attending: Emergency Medicine | Admitting: Emergency Medicine

## 2022-02-28 DIAGNOSIS — R079 Chest pain, unspecified: Secondary | ICD-10-CM

## 2022-02-28 LAB — URINALYSIS, ROUTINE W REFLEX MICROSCOPIC
Bilirubin Urine: NEGATIVE
Glucose, UA: NEGATIVE mg/dL
Hgb urine dipstick: NEGATIVE
Ketones, ur: NEGATIVE mg/dL
Leukocytes,Ua: NEGATIVE
Nitrite: NEGATIVE
Protein, ur: NEGATIVE mg/dL
Specific Gravity, Urine: 1.013 (ref 1.005–1.030)
pH: 6 (ref 5.0–8.0)

## 2022-02-28 LAB — HEPATIC FUNCTION PANEL
ALT: 106 U/L — ABNORMAL HIGH (ref 0–44)
AST: 143 U/L — ABNORMAL HIGH (ref 15–41)
Albumin: 4.2 g/dL (ref 3.5–5.0)
Alkaline Phosphatase: 116 U/L (ref 38–126)
Bilirubin, Direct: 0.1 mg/dL (ref 0.0–0.2)
Indirect Bilirubin: 0.6 mg/dL (ref 0.3–0.9)
Total Bilirubin: 0.7 mg/dL (ref 0.3–1.2)
Total Protein: 7.6 g/dL (ref 6.5–8.1)

## 2022-02-28 LAB — TROPONIN I (HIGH SENSITIVITY): Troponin I (High Sensitivity): 5 ng/L (ref <18)

## 2022-02-28 NOTE — Discharge Instructions (Addendum)

## 2022-02-28 NOTE — ED Provider Notes (Signed)
[facility] Provider Note    Event Date/Time   First MD Initiated Contact with Patient 02/28/22 0024     (approximate)   History   Chest Pain   HPI  Nathan Nielsen is a 52 y.o. male who has a prior history of NSTEMI status post stent placement as well as regular alcohol use and hypertension.  He presents for evaluation of several days of intermittent chest pain that was more constant today.  He says it is in the left middle part of his chest and is mild and dull but it is atypical for him to have this kind of pain so he thought he should get it checked out.  He goes to [facility] for primary care and has seen a cardiologist in the past because he had a stent about 16 months ago.  He said that he has a prescription for nitroglycerin but he never uses it including recently.  He has been compliant with his other medications.  He denies nausea, vomiting, shortness of breath, abdominal pain.     Physical Exam   Triage Vital Signs: ED Triage Vitals  Enc Vitals Group     BP 02/27/22 2222 (!) 170/90     Pulse Rate 02/27/22 2222 65     Resp 02/27/22 2222 17     Temp 02/27/22 2222 98.5 F (36.9 C)     Temp Source 02/27/22 2222 Oral     SpO2 02/27/22 2222 99 %     Weight 02/27/22 2223 75.8 kg (167 lb)     Height 02/27/22 2223 1.575 m ('5\' 2"'$ )     Head Circumference --      Peak Flow --      Pain Score 02/27/22 2222 4     Pain Loc --      Pain Edu? --      Excl. in [place]? --     Most recent vital signs: Vitals:   02/27/22 2222 02/28/22 0100  BP: (!) 170/90   Pulse: 65   Resp: 17 17  Temp: 98.5 F (36.9 C)   SpO2: 99%      General: Awake, no distress.  CV:  Good peripheral perfusion.  Normal heart sounds.  Regular rate and rhythm. Resp:  Normal effort. Speaking easily and comfortably, no accessory muscle usage nor intercostal retractions.  Lungs are clear to auscultation. Abd:  No distention.  No tenderness to palpation of the epigastrium or  right upper quadrant with negative Murphy sign.  No lower abdominal tenderness.   ED Results / Procedures / Treatments   Labs (all labs ordered are listed, but only abnormal results are displayed) Labs Reviewed  BASIC METABOLIC PANEL - Abnormal; Notable for the following components:      Result Value   Sodium 134 (*)    All other components within normal limits  CBC - Abnormal; Notable for the following components:   RBC 4.11 (*)    Platelets 138 (*)    All other components within normal limits  URINALYSIS, ROUTINE W REFLEX MICROSCOPIC - Abnormal; Notable for the following components:   Color, Urine STRAW (*)    APPearance CLEAR (*)    All other components within normal limits  HEPATIC FUNCTION PANEL - Abnormal; Notable for the following components:   AST 143 (*)    ALT 106 (*)    All other components within normal limits  TROPONIN I (HIGH SENSITIVITY)  TROPONIN I (HIGH SENSITIVITY)  EKG  ED ECG REPORT I, Hinda Kehr, the attending physician, personally viewed and interpreted this ECG.  Date: 02/27/2022 EKG Time: 22: 20 Rate: 58 Rhythm: Borderline sinus bradycardia QRS Axis: normal Intervals: normal ST/T Wave abnormalities: normal Narrative Interpretation: no evidence of acute ischemia    RADIOLOGY I viewed and interpreted the patient's two-view chest x-ray.  There is no evidence of pneumonia, widened mediastinum, nor other acute abnormality.  I also read the radiologist's report, which confirmed no acute findings.    PROCEDURES:  Critical Care performed: No  .1-3 Lead EKG Interpretation  Performed by: Hinda Kehr, MD Authorized by: Hinda Kehr, MD     Interpretation: normal     ECG rate:  66   ECG rate assessment: normal     Rhythm: sinus rhythm     Ectopy: none     Conduction: normal      MEDICATIONS ORDERED IN ED: Medications - No data to display   IMPRESSION / MDM / [place] / ED COURSE  I reviewed the triage vital signs  and the nursing notes.                              Differential diagnosis includes, but is not limited to, angina, ACS including unstable angina, musculoskeletal strain, pneumonia, AAS, pneumothorax.  Patient's presentation is most consistent with acute presentation with potential threat to life or bodily function.  Labs/studies ordered: Troponin x 2, hepatic function panel, basic metabolic panel, CBC, urinalysis, two-view chest x-ray, EKG.  Patient has history of ACS and PCI a, but has reassuring EKG and physical exam at this time.  He has not tried taking any nitro and his pain is mild.  Initial high-sensitivity troponin is within normal limits and basic metabolic panel and CBC are reassuring.  Hepatic function panel is pending.  Urinalysis is negative.  Given his reassuring EKG and the relatively mild nature of the symptoms, I will repeat a second troponin and reassess to determine if he is comfortable with the plan for outpatient follow-up or if he would benefit from hospitalization.  I am considering hospitalization but given his relatively recent workup and cath/PCI, it may not be helpful for him at this time versus close follow-up.  The patient is on the cardiac monitor to evaluate for evidence of arrhythmia and/or significant heart rate changes.   Clinical Course as of 02/28/22 0211  Mon Feb 28, 2022  0103 Hepatic function panel(!) Of note, patient has mildly elevated AST and ALT.  However, he has no tenderness palpation of the epigastrium or right upper quadrant.  I asked him about alcohol use and he said that he drinks alcohol every day.  This is likely incidental LFT elevation and not related to the current presentation. [CF]  0205 The patient is comfortable with the plan for discharge and outpatient follow-up.  Of note, I encouraged him to go back to his cardiologist at [facility], but he said that he has been referred to a local cardiologist and he thinks he has an appointment this week.  I  do not see it in the system right now, so I put in an ambulatory cardiology referral to see if we can facilitate local follow-up. [CF]    Clinical Course User Index [CF] Hinda Kehr, MD     FINAL CLINICAL IMPRESSION(S) / ED DIAGNOSES   Final diagnoses:  Chest pain, unspecified type     Rx / DC Orders  ED Discharge Orders          Ordered    Ambulatory referral to Cardiology       Comments: If you have not heard from the Cardiology office within the next 72 hours please call [phone].   02/28/22 0206             Note:  This document was prepared using Dragon voice recognition software and may include unintentional dictation errors.   Hinda Kehr, MD 02/28/22 [phone]

## 2022-05-31 ENCOUNTER — Other Ambulatory Visit: Payer: Self-pay | Admitting: Family Medicine

## 2022-05-31 DIAGNOSIS — K7460 Unspecified cirrhosis of liver: Secondary | ICD-10-CM

## 2022-06-17 ENCOUNTER — Ambulatory Visit: Admission: RE | Admit: 2022-06-17 | Payer: BLUE CROSS/BLUE SHIELD | Source: Ambulatory Visit

## 2022-08-09 ENCOUNTER — Ambulatory Visit: Payer: BLUE CROSS/BLUE SHIELD | Admitting: Cardiology

## 2022-08-09 ENCOUNTER — Ambulatory Visit
Admission: RE | Admit: 2022-08-09 | Discharge: 2022-08-09 | Disposition: A | Discharge status: Home / Self Care | Payer: BLUE CROSS/BLUE SHIELD | Source: Ambulatory Visit | Attending: Family Medicine | Admitting: Family Medicine

## 2022-08-09 DIAGNOSIS — K7460 Unspecified cirrhosis of liver: Secondary | ICD-10-CM | POA: Insufficient documentation

## 2022-08-11 NOTE — Progress Notes (Signed)
This encounter was created in error - please disregard.

## 2022-08-11 NOTE — Progress Notes (Deleted)
This encounter was created in error - please disregard.  This encounter was created in error - please disregard.

## 2023-01-11 IMAGING — CR DG CHEST 2V
1 series · 2 of 2 positions shown · non-contrast
Comparison: None.

CLINICAL DATA: Chest pain

EXAM:
CHEST - 2 VIEW

[Series 1: dg chest 2 view · 0.14mm/px · 2 of 2 slices shown]
[im 1/2]
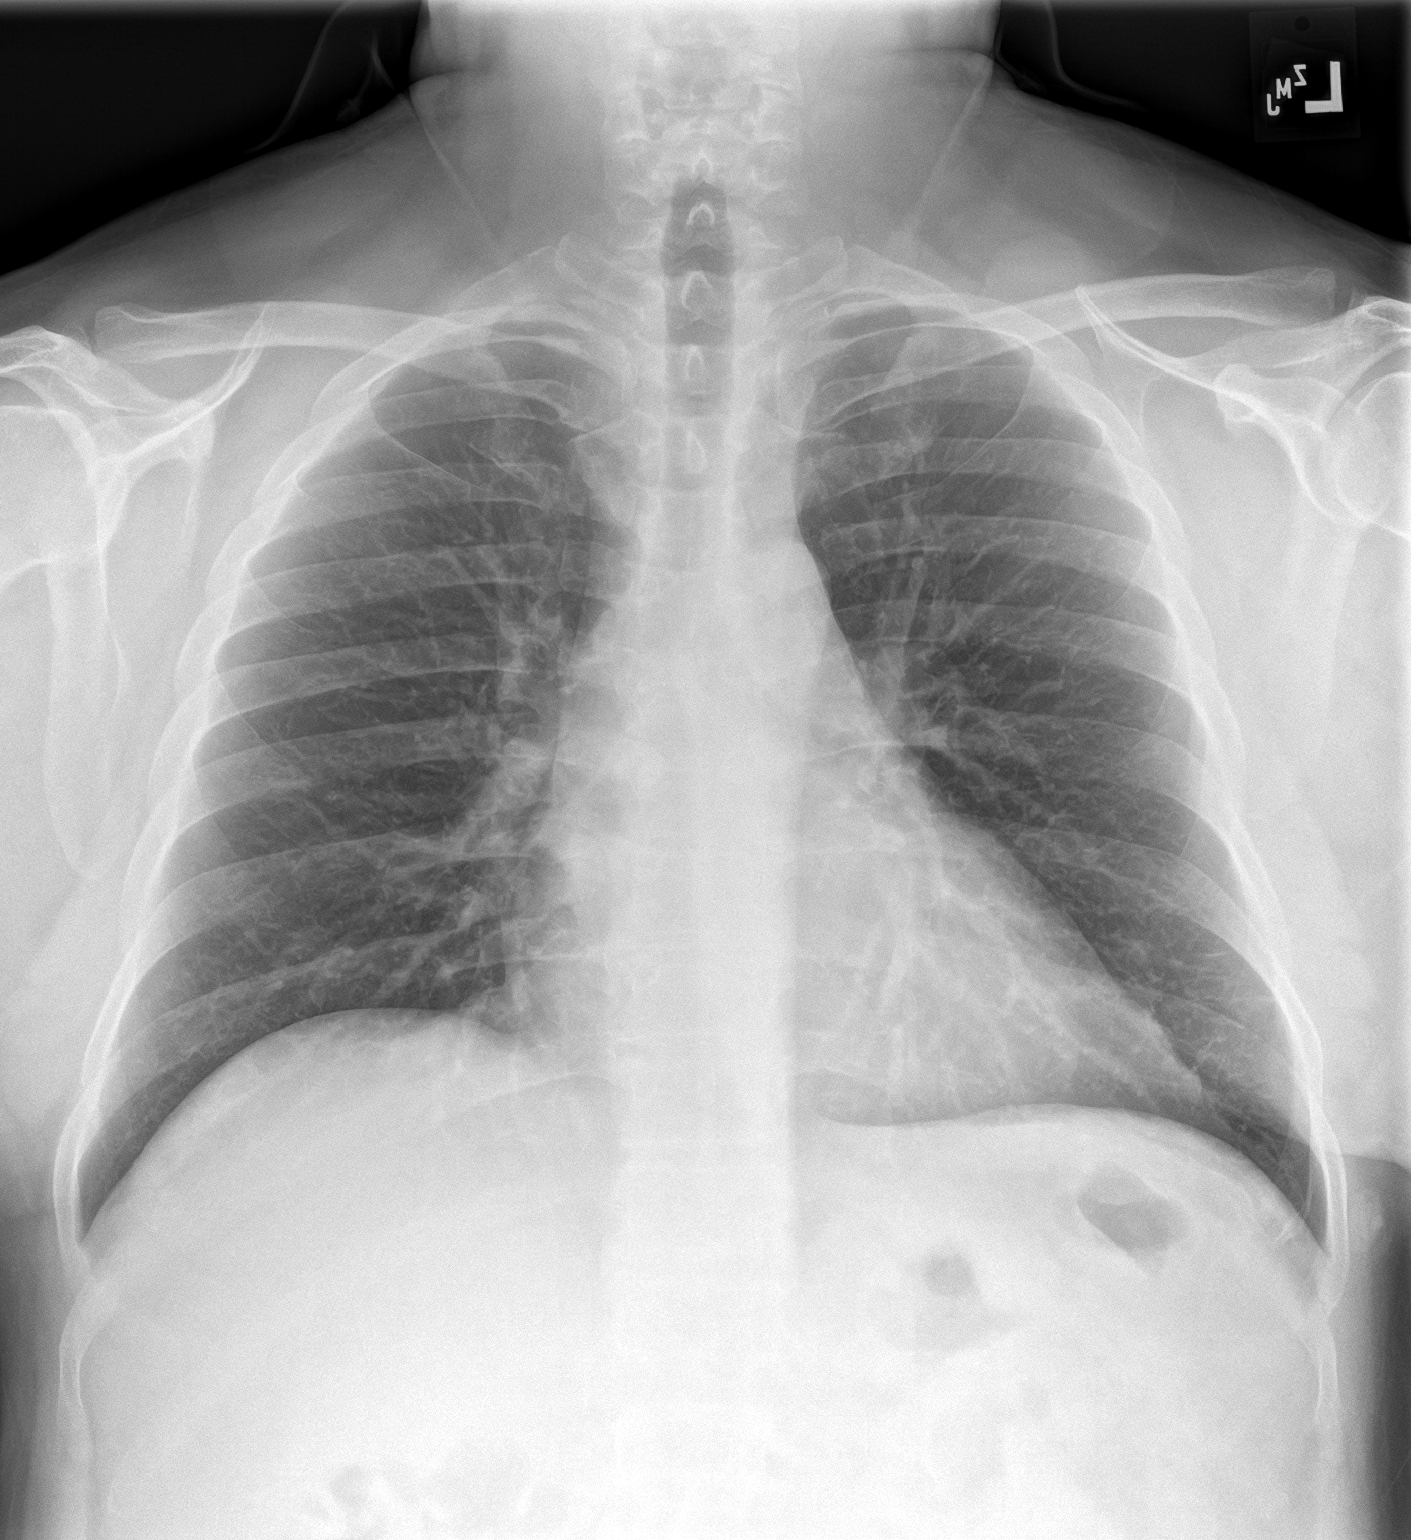
[im 2/2]
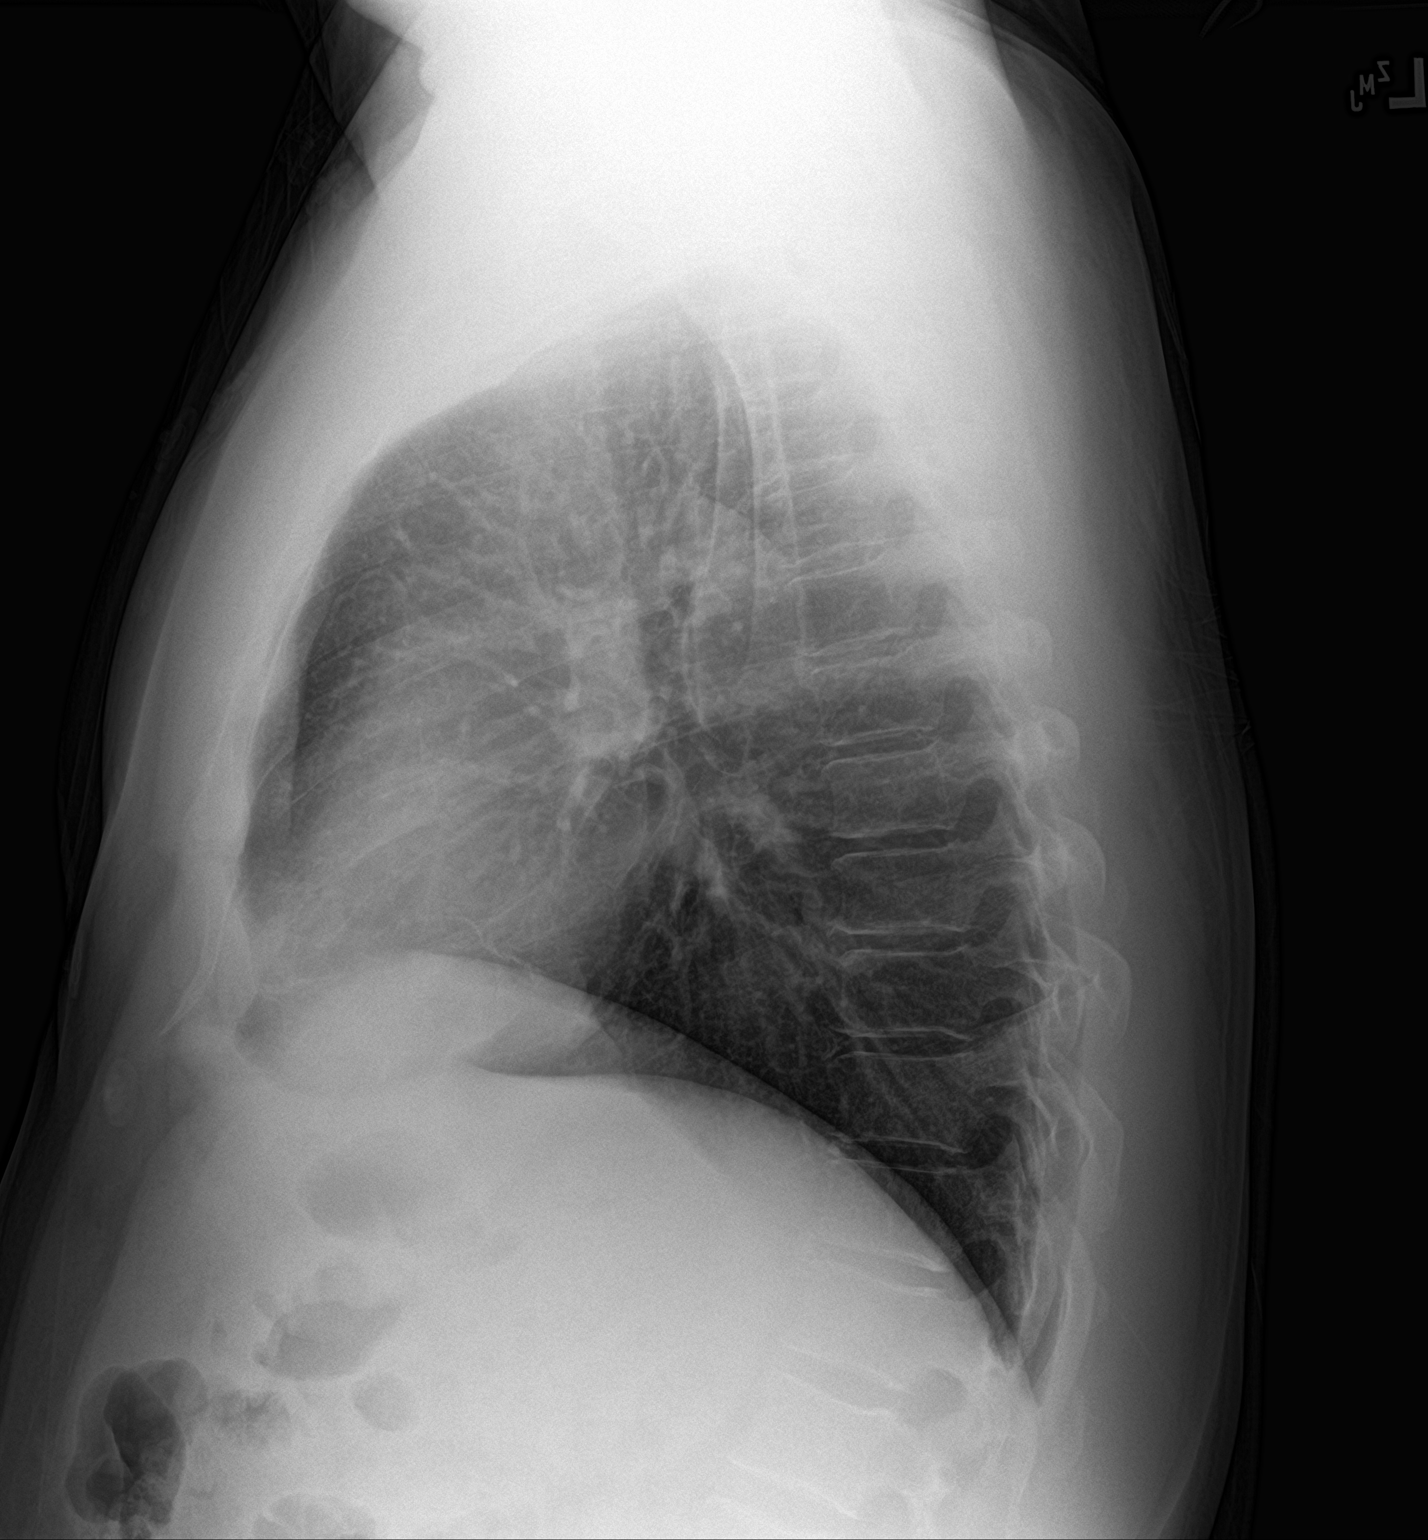

[2 of 2 positions shown; findings below may reference images not displayed]

FINDINGS: The heart size and mediastinal contours are within normal limits.
Both lungs are clear. The visualized skeletal structures are
unremarkable.
IMPRESSION: No active cardiopulmonary disease.

## 2023-04-05 ENCOUNTER — Other Ambulatory Visit: Payer: Self-pay | Admitting: Family Medicine

## 2023-04-05 DIAGNOSIS — K7460 Unspecified cirrhosis of liver: Secondary | ICD-10-CM

## 2023-04-25 ENCOUNTER — Ambulatory Visit
Admission: RE | Admit: 2023-04-25 | Discharge: 2023-04-25 | Disposition: A | Discharge status: Home / Self Care | Payer: Self-pay | Source: Ambulatory Visit | Attending: Family Medicine | Admitting: Family Medicine

## 2023-04-25 DIAGNOSIS — K7460 Unspecified cirrhosis of liver: Secondary | ICD-10-CM | POA: Diagnosis present

## 2023-10-06 ENCOUNTER — Telehealth: Payer: Self-pay

## 2023-10-06 NOTE — Telephone Encounter (Signed)
 Contacted the interpreter line to assist with scheduling the patient for an appointment. Spoke with interpreter Magali (ID# [phone]). Attempted to reach the patient by phone, but there was no answer. A voicemail was left requesting that the patient return our call to schedule an appointment with the Nurse Practitioner.  Please note, the patient needs to be scheduled on Wednesdays only due to his diagnosis. The provider has determined that Wednesday appointments are most appropriate for his care.
